# Patient Record
Sex: Female | Born: 1935 | Race: White | Hispanic: No | Smoking: Never smoker
Health system: Southern US, Community
[De-identification: ages and names within clinical notes are randomized; demographics above are authoritative.]

## PROBLEM LIST (undated history)

## (undated) DIAGNOSIS — F0280 Dementia in other diseases classified elsewhere without behavioral disturbance: Secondary | ICD-10-CM

## (undated) DIAGNOSIS — G2581 Restless legs syndrome: Secondary | ICD-10-CM

## (undated) DIAGNOSIS — H9190 Unspecified hearing loss, unspecified ear: Secondary | ICD-10-CM

## (undated) DIAGNOSIS — G309 Alzheimer's disease, unspecified: Secondary | ICD-10-CM

## (undated) DIAGNOSIS — I10 Essential (primary) hypertension: Secondary | ICD-10-CM

## (undated) DIAGNOSIS — D649 Anemia, unspecified: Secondary | ICD-10-CM

## (undated) HISTORY — PX: OTHER SURGICAL HISTORY: SHX169

## (undated) HISTORY — PX: JOINT REPLACEMENT: SHX530

---

## 2018-01-18 ENCOUNTER — Encounter (HOSPITAL_BASED_OUTPATIENT_CLINIC_OR_DEPARTMENT_OTHER): Payer: Self-pay | Admitting: Emergency Medicine

## 2018-01-18 ENCOUNTER — Emergency Department (HOSPITAL_BASED_OUTPATIENT_CLINIC_OR_DEPARTMENT_OTHER): Payer: Medicare Other

## 2018-01-18 ENCOUNTER — Other Ambulatory Visit: Payer: Self-pay

## 2018-01-18 ENCOUNTER — Inpatient Hospital Stay (HOSPITAL_BASED_OUTPATIENT_CLINIC_OR_DEPARTMENT_OTHER)
Admission: EM | Admit: 2018-01-18 | Discharge: 2018-01-22 | DRG: 481 | Disposition: A | Discharge status: Skilled Nursing Facility | Payer: Medicare Other | Source: Skilled Nursing Facility | Attending: Internal Medicine | Admitting: Internal Medicine

## 2018-01-18 DIAGNOSIS — Z96641 Presence of right artificial hip joint: Secondary | ICD-10-CM | POA: Diagnosis present

## 2018-01-18 DIAGNOSIS — S72009A Fracture of unspecified part of neck of unspecified femur, initial encounter for closed fracture: Secondary | ICD-10-CM | POA: Diagnosis present

## 2018-01-18 DIAGNOSIS — Y92129 Unspecified place in nursing home as the place of occurrence of the external cause: Secondary | ICD-10-CM

## 2018-01-18 DIAGNOSIS — I10 Essential (primary) hypertension: Secondary | ICD-10-CM | POA: Diagnosis present

## 2018-01-18 DIAGNOSIS — N390 Urinary tract infection, site not specified: Secondary | ICD-10-CM | POA: Diagnosis present

## 2018-01-18 DIAGNOSIS — H9190 Unspecified hearing loss, unspecified ear: Secondary | ICD-10-CM | POA: Diagnosis present

## 2018-01-18 DIAGNOSIS — B9620 Unspecified Escherichia coli [E. coli] as the cause of diseases classified elsewhere: Secondary | ICD-10-CM | POA: Diagnosis present

## 2018-01-18 DIAGNOSIS — W1839XA Other fall on same level, initial encounter: Secondary | ICD-10-CM | POA: Diagnosis present

## 2018-01-18 DIAGNOSIS — K219 Gastro-esophageal reflux disease without esophagitis: Secondary | ICD-10-CM | POA: Diagnosis present

## 2018-01-18 DIAGNOSIS — Z888 Allergy status to other drugs, medicaments and biological substances status: Secondary | ICD-10-CM | POA: Diagnosis not present

## 2018-01-18 DIAGNOSIS — S72002A Fracture of unspecified part of neck of left femur, initial encounter for closed fracture: Secondary | ICD-10-CM

## 2018-01-18 DIAGNOSIS — G309 Alzheimer's disease, unspecified: Secondary | ICD-10-CM | POA: Diagnosis present

## 2018-01-18 DIAGNOSIS — S72142A Displaced intertrochanteric fracture of left femur, initial encounter for closed fracture: Secondary | ICD-10-CM | POA: Diagnosis present

## 2018-01-18 DIAGNOSIS — D62 Acute posthemorrhagic anemia: Secondary | ICD-10-CM | POA: Diagnosis not present

## 2018-01-18 DIAGNOSIS — Z882 Allergy status to sulfonamides status: Secondary | ICD-10-CM | POA: Diagnosis not present

## 2018-01-18 DIAGNOSIS — F0281 Dementia in other diseases classified elsewhere with behavioral disturbance: Secondary | ICD-10-CM | POA: Diagnosis not present

## 2018-01-18 DIAGNOSIS — F0280 Dementia in other diseases classified elsewhere without behavioral disturbance: Secondary | ICD-10-CM | POA: Diagnosis present

## 2018-01-18 DIAGNOSIS — G2581 Restless legs syndrome: Secondary | ICD-10-CM | POA: Diagnosis present

## 2018-01-18 HISTORY — DX: Anemia, unspecified: D64.9

## 2018-01-18 HISTORY — DX: Dementia in other diseases classified elsewhere, unspecified severity, without behavioral disturbance, psychotic disturbance, mood disturbance, and anxiety: F02.80

## 2018-01-18 HISTORY — DX: Essential (primary) hypertension: I10

## 2018-01-18 HISTORY — DX: Alzheimer's disease, unspecified: G30.9

## 2018-01-18 HISTORY — DX: Restless legs syndrome: G25.81

## 2018-01-18 HISTORY — DX: Unspecified hearing loss, unspecified ear: H91.90

## 2018-01-18 LAB — CBC WITH DIFFERENTIAL/PLATELET
Basophils Absolute: 0 10*3/uL (ref 0.0–0.1)
Basophils Relative: 0 %
Eosinophils Absolute: 0 10*3/uL (ref 0.0–0.7)
Eosinophils Relative: 0 %
HCT: 38.4 % (ref 36.0–46.0)
Hemoglobin: 12.9 g/dL (ref 12.0–15.0)
Lymphocytes Relative: 7 %
Lymphs Abs: 0.6 10*3/uL — ABNORMAL LOW (ref 0.7–4.0)
MCH: 31.9 pg (ref 26.0–34.0)
MCHC: 33.6 g/dL (ref 30.0–36.0)
MCV: 94.8 fL (ref 78.0–100.0)
Monocytes Absolute: 0.5 10*3/uL (ref 0.1–1.0)
Monocytes Relative: 6 %
Neutro Abs: 8 10*3/uL — ABNORMAL HIGH (ref 1.7–7.7)
Neutrophils Relative %: 87 %
Platelets: 156 10*3/uL (ref 150–400)
RBC: 4.05 MIL/uL (ref 3.87–5.11)
RDW: 13.2 % (ref 11.5–15.5)
WBC: 9.2 10*3/uL (ref 4.0–10.5)

## 2018-01-18 LAB — BASIC METABOLIC PANEL
Anion gap: 10 (ref 5–15)
BUN: 26 mg/dL — ABNORMAL HIGH (ref 8–23)
CO2: 27 mmol/L (ref 22–32)
Calcium: 8.6 mg/dL — ABNORMAL LOW (ref 8.9–10.3)
Chloride: 105 mmol/L (ref 98–111)
Creatinine, Ser: 0.8 mg/dL (ref 0.44–1.00)
GFR calc Af Amer: >60 mL/min (ref >60)
GFR calc non Af Amer: >60 mL/min (ref >60)
Glucose, Bld: 170 mg/dL — ABNORMAL HIGH (ref 70–99)
Potassium: 3.7 mmol/L (ref 3.5–5.1)
Sodium: 142 mmol/L (ref 135–145)

## 2018-01-18 LAB — URINALYSIS, ROUTINE W REFLEX MICROSCOPIC
Bilirubin Urine: NEGATIVE
Glucose, UA: NEGATIVE mg/dL
Ketones, ur: NEGATIVE mg/dL
Leukocytes, UA: NEGATIVE
Nitrite: POSITIVE — AB
Protein, ur: NEGATIVE mg/dL
Specific Gravity, Urine: >1.03 — ABNORMAL HIGH (ref 1.005–1.030)
pH: 5.5 (ref 5.0–8.0)

## 2018-01-18 LAB — URINALYSIS, MICROSCOPIC (REFLEX)

## 2018-01-18 MED ORDER — HYDROMORPHONE HCL 1 MG/ML IJ SOLN
0.5000 mg | Freq: Once | INTRAMUSCULAR | Status: AC
  Administered 2018-01-18: 0.5 mg via INTRAVENOUS
  Filled 2018-01-18: qty 1
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
## 2018-01-18 NOTE — ED Notes (Signed)
Left leg is shortened and externally rotated.

## 2018-01-18 NOTE — Plan of Care (Addendum)
84yo F from SNF w dementia, hard of hearing HTN, not on blood thinner,  Sp mechanical fall Left hip FRX  Dr Marciano SequinYetes will see in AM will operate afternoon  Clear for breakfast and NPO after that  Accepted to [facility] As inpatient   Racer Quam 10:32 PM

## 2018-01-18 NOTE — ED Notes (Signed)
Son took personal wheelchair to car

## 2018-01-18 NOTE — ED Notes (Signed)
Dr Juleen ChinaKohut made aware of left leg shortening and rotation.
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
## 2018-01-19 ENCOUNTER — Encounter (HOSPITAL_COMMUNITY)
Admission: EM | Disposition: A | Discharge status: Skilled Nursing Facility | Payer: Self-pay | Source: Skilled Nursing Facility | Attending: Internal Medicine

## 2018-01-19 ENCOUNTER — Encounter (HOSPITAL_COMMUNITY): Payer: Self-pay | Admitting: Internal Medicine

## 2018-01-19 ENCOUNTER — Inpatient Hospital Stay (HOSPITAL_COMMUNITY): Payer: Medicare Other

## 2018-01-19 ENCOUNTER — Inpatient Hospital Stay (HOSPITAL_COMMUNITY): Payer: Medicare Other | Admitting: Anesthesiology

## 2018-01-19 DIAGNOSIS — I10 Essential (primary) hypertension: Secondary | ICD-10-CM | POA: Diagnosis present

## 2018-01-19 DIAGNOSIS — F0280 Dementia in other diseases classified elsewhere without behavioral disturbance: Secondary | ICD-10-CM | POA: Diagnosis present

## 2018-01-19 DIAGNOSIS — G309 Alzheimer's disease, unspecified: Secondary | ICD-10-CM

## 2018-01-19 DIAGNOSIS — S72002A Fracture of unspecified part of neck of left femur, initial encounter for closed fracture: Secondary | ICD-10-CM

## 2018-01-19 HISTORY — PX: INTRAMEDULLARY (IM) NAIL INTERTROCHANTERIC: SHX5875

## 2018-01-19 LAB — SURGICAL PCR SCREEN
MRSA, PCR: NEGATIVE
STAPHYLOCOCCUS AUREUS: NEGATIVE

## 2018-01-19 SURGERY — FIXATION, FRACTURE, INTERTROCHANTERIC, WITH INTRAMEDULLARY ROD
Anesthesia: General | Laterality: Left

## 2018-01-19 MED ORDER — LORAZEPAM 1 MG PO TABS
1.0000 mg | ORAL_TABLET | Freq: Three times a day (TID) | ORAL | Status: DC
  Administered 2018-01-20 – 2018-01-21 (×3): 1 mg via ORAL
  Filled 2018-01-19 (×6): qty 1

## 2018-01-19 MED ORDER — PHENYLEPHRINE 40 MCG/ML (10ML) SYRINGE FOR IV PUSH (FOR BLOOD PRESSURE SUPPORT)
PREFILLED_SYRINGE | INTRAVENOUS | Status: DC | PRN
  Administered 2018-01-19 (×2): 80 ug via INTRAVENOUS

## 2018-01-19 MED ORDER — SODIUM CHLORIDE 0.9 % IR SOLN
Status: DC | PRN
  Administered 2018-01-19: 1000 mL

## 2018-01-19 MED ORDER — BUPIVACAINE HCL (PF) 0.25 % IJ SOLN
INTRAMUSCULAR | Status: AC
  Filled 2018-01-19: qty 30

## 2018-01-19 MED ORDER — METOCLOPRAMIDE HCL 5 MG PO TABS: 5.0000 mg | ORAL_TABLET | Freq: Three times a day (TID) | ORAL | Status: DC | PRN

## 2018-01-19 MED ORDER — SODIUM CHLORIDE 0.9 % IV SOLN
1.0000 g | Freq: Once | INTRAVENOUS | Status: AC
  Administered 2018-01-19: 1 g via INTRAVENOUS
  Filled 2018-01-19: qty 10

## 2018-01-19 MED ORDER — PROPOFOL 10 MG/ML IV BOLUS
INTRAVENOUS | Status: AC
  Filled 2018-01-19: qty 20

## 2018-01-19 MED ORDER — PHENOL 1.4 % MT LIQD: 1.0000 | OROMUCOSAL | Status: DC | PRN

## 2018-01-19 MED ORDER — CITALOPRAM HYDROBROMIDE 20 MG PO TABS
20.0000 mg | ORAL_TABLET | Freq: Every day | ORAL | Status: DC
  Administered 2018-01-20 – 2018-01-22 (×3): 20 mg via ORAL
  Filled 2018-01-19 (×3): qty 1

## 2018-01-19 MED ORDER — SUGAMMADEX SODIUM 200 MG/2ML IV SOLN
INTRAVENOUS | Status: DC | PRN
  Administered 2018-01-19: 200 mg via INTRAVENOUS

## 2018-01-19 MED ORDER — PHENYLEPHRINE 40 MCG/ML (10ML) SYRINGE FOR IV PUSH (FOR BLOOD PRESSURE SUPPORT)
PREFILLED_SYRINGE | INTRAVENOUS | Status: AC
  Filled 2018-01-19: qty 10

## 2018-01-19 MED ORDER — BUPIVACAINE HCL 0.25 % IJ SOLN
INTRAMUSCULAR | Status: DC | PRN
  Administered 2018-01-19: 30 mL

## 2018-01-19 MED ORDER — ONDANSETRON HCL 4 MG/2ML IJ SOLN
INTRAMUSCULAR | Status: AC
  Filled 2018-01-19: qty 2

## 2018-01-19 MED ORDER — LACTATED RINGERS IV SOLN
INTRAVENOUS | Status: DC | PRN
  Administered 2018-01-19: 20:00:00 via INTRAVENOUS

## 2018-01-19 MED ORDER — MORPHINE SULFATE (PF) 2 MG/ML IV SOLN
0.5000 mg | INTRAVENOUS | Status: DC | PRN
  Administered 2018-01-19: 0.5 mg via INTRAVENOUS
  Filled 2018-01-19: qty 1

## 2018-01-19 MED ORDER — RISPERIDONE 0.25 MG PO TABS
0.2500 mg | ORAL_TABLET | Freq: Every day | ORAL | Status: DC
  Administered 2018-01-20 – 2018-01-21 (×2): 0.25 mg via ORAL
  Filled 2018-01-19 (×3): qty 1

## 2018-01-19 MED ORDER — HYDROCODONE-ACETAMINOPHEN 5-325 MG PO TABS: 1.0000 | ORAL_TABLET | ORAL | Status: DC | PRN

## 2018-01-19 MED ORDER — ONDANSETRON HCL 4 MG/2ML IJ SOLN
INTRAMUSCULAR | Status: DC | PRN
  Administered 2018-01-19: 4 mg via INTRAVENOUS

## 2018-01-19 MED ORDER — DONEPEZIL HCL 10 MG PO TABS
10.0000 mg | ORAL_TABLET | Freq: Every day | ORAL | Status: DC
  Administered 2018-01-20 – 2018-01-21 (×2): 10 mg via ORAL
  Filled 2018-01-19 (×3): qty 1

## 2018-01-19 MED ORDER — POVIDONE-IODINE 10 % EX SWAB: 2.0000 "application " | Freq: Once | CUTANEOUS | Status: DC

## 2018-01-19 MED ORDER — CEFAZOLIN SODIUM-DEXTROSE 2-4 GM/100ML-% IV SOLN: 2.0000 g | INTRAVENOUS | Status: DC

## 2018-01-19 MED ORDER — LIDOCAINE 2% (20 MG/ML) 5 ML SYRINGE
INTRAMUSCULAR | Status: AC
Start: 2018-01-19 — End: ?
  Filled 2018-01-19: qty 5

## 2018-01-19 MED ORDER — ONDANSETRON HCL 4 MG PO TABS: 4.0000 mg | ORAL_TABLET | Freq: Four times a day (QID) | ORAL | Status: DC | PRN

## 2018-01-19 MED ORDER — DIVALPROEX SODIUM 125 MG PO DR TAB
125.0000 mg | DELAYED_RELEASE_TABLET | Freq: Every day | ORAL | Status: DC
  Filled 2018-01-19: qty 1

## 2018-01-19 MED ORDER — MENTHOL 3 MG MT LOZG: 1.0000 | LOZENGE | OROMUCOSAL | Status: DC | PRN

## 2018-01-19 MED ORDER — HYDROCODONE-ACETAMINOPHEN 5-325 MG PO TABS: 1.0000 | ORAL_TABLET | Freq: Four times a day (QID) | ORAL | Status: DC | PRN

## 2018-01-19 MED ORDER — SODIUM CHLORIDE 0.45 % IV SOLN
INTRAVENOUS | Status: DC
  Administered 2018-01-19: 11:00:00 via INTRAVENOUS

## 2018-01-19 MED ORDER — FENTANYL CITRATE (PF) 100 MCG/2ML IJ SOLN
INTRAMUSCULAR | Status: DC | PRN
  Administered 2018-01-19: 50 ug via INTRAVENOUS
  Administered 2018-01-19 (×2): 25 ug via INTRAVENOUS

## 2018-01-19 MED ORDER — SUCCINYLCHOLINE CHLORIDE 200 MG/10ML IV SOSY
PREFILLED_SYRINGE | INTRAVENOUS | Status: AC
  Filled 2018-01-19: qty 10

## 2018-01-19 MED ORDER — ASPIRIN EC 325 MG PO TBEC
325.0000 mg | DELAYED_RELEASE_TABLET | Freq: Every day | ORAL | Status: DC
  Administered 2018-01-20 – 2018-01-22 (×3): 325 mg via ORAL
  Filled 2018-01-19 (×3): qty 1

## 2018-01-19 MED ORDER — MORPHINE SULFATE (PF) 2 MG/ML IV SOLN
2.0000 mg | INTRAVENOUS | Status: DC | PRN
  Administered 2018-01-19 (×2): 2 mg via INTRAVENOUS
  Filled 2018-01-19 (×2): qty 1

## 2018-01-19 MED ORDER — METOCLOPRAMIDE HCL 5 MG/ML IJ SOLN: 5.0000 mg | Freq: Three times a day (TID) | INTRAMUSCULAR | Status: DC | PRN

## 2018-01-19 MED ORDER — PROPOFOL 10 MG/ML IV BOLUS
INTRAVENOUS | Status: DC | PRN
  Administered 2018-01-19: 50 mg via INTRAVENOUS

## 2018-01-19 MED ORDER — LACTATED RINGERS IV SOLN
INTRAVENOUS | Status: DC
Start: 2018-01-19 — End: 2018-01-19

## 2018-01-19 MED ORDER — SODIUM CHLORIDE 0.45 % IV SOLN
INTRAVENOUS | Status: DC
  Administered 2018-01-20: 75 mL/h via INTRAVENOUS
  Administered 2018-01-21 (×2): via INTRAVENOUS

## 2018-01-19 MED ORDER — DOCUSATE SODIUM 100 MG PO CAPS
100.0000 mg | ORAL_CAPSULE | Freq: Two times a day (BID) | ORAL | Status: DC
  Administered 2018-01-20 – 2018-01-22 (×5): 100 mg via ORAL
  Filled 2018-01-19 (×5): qty 1

## 2018-01-19 MED ORDER — FENTANYL CITRATE (PF) 100 MCG/2ML IJ SOLN
INTRAMUSCULAR | Status: AC
  Filled 2018-01-19: qty 2

## 2018-01-19 MED ORDER — DIVALPROEX SODIUM 125 MG PO CSDR
125.0000 mg | DELAYED_RELEASE_CAPSULE | Freq: Every day | ORAL | Status: DC
  Administered 2018-01-21: 125 mg via ORAL
  Filled 2018-01-19 (×4): qty 1

## 2018-01-19 MED ORDER — SODIUM CHLORIDE 0.9 % IV SOLN
1.0000 g | INTRAVENOUS | Status: DC
  Filled 2018-01-19: qty 10

## 2018-01-19 MED ORDER — TRAMADOL HCL 50 MG PO TABS
50.0000 mg | ORAL_TABLET | Freq: Four times a day (QID) | ORAL | Status: DC
  Administered 2018-01-20 (×2): 50 mg via ORAL
  Filled 2018-01-19 (×3): qty 1

## 2018-01-19 MED ORDER — SUGAMMADEX SODIUM 200 MG/2ML IV SOLN
INTRAVENOUS | Status: AC
  Filled 2018-01-19: qty 2

## 2018-01-19 MED ORDER — ACETAMINOPHEN 500 MG PO TABS
500.0000 mg | ORAL_TABLET | Freq: Four times a day (QID) | ORAL | Status: AC
  Administered 2018-01-20 (×3): 500 mg via ORAL
  Filled 2018-01-19 (×4): qty 1

## 2018-01-19 MED ORDER — HYDROCHLOROTHIAZIDE 12.5 MG PO CAPS
12.5000 mg | ORAL_CAPSULE | Freq: Every day | ORAL | Status: DC
  Administered 2018-01-21 – 2018-01-22 (×2): 12.5 mg via ORAL
  Filled 2018-01-19 (×3): qty 1

## 2018-01-19 MED ORDER — PROMETHAZINE HCL 25 MG/ML IJ SOLN: 6.2500 mg | INTRAMUSCULAR | Status: DC | PRN

## 2018-01-19 MED ORDER — ONDANSETRON HCL 4 MG/2ML IJ SOLN: 4.0000 mg | Freq: Four times a day (QID) | INTRAMUSCULAR | Status: DC | PRN

## 2018-01-19 MED ORDER — CHLORHEXIDINE GLUCONATE 4 % EX LIQD: 60.0000 mL | Freq: Once | CUTANEOUS | Status: DC

## 2018-01-19 MED ORDER — LISINOPRIL 20 MG PO TABS
20.0000 mg | ORAL_TABLET | Freq: Every day | ORAL | Status: DC
Start: 2018-01-20 — End: 2018-01-22
  Administered 2018-01-21 – 2018-01-22 (×2): 20 mg via ORAL
  Filled 2018-01-19 (×3): qty 1

## 2018-01-19 MED ORDER — PANTOPRAZOLE SODIUM 40 MG PO TBEC
40.0000 mg | DELAYED_RELEASE_TABLET | Freq: Every day | ORAL | Status: DC
  Administered 2018-01-20 – 2018-01-22 (×3): 40 mg via ORAL
  Filled 2018-01-19 (×3): qty 1

## 2018-01-19 MED ORDER — ONDANSETRON HCL 4 MG/2ML IJ SOLN
4.0000 mg | Freq: Once | INTRAMUSCULAR | Status: AC
  Administered 2018-01-19: 4 mg via INTRAVENOUS
  Filled 2018-01-19: qty 2

## 2018-01-19 MED ORDER — DEXAMETHASONE SODIUM PHOSPHATE 10 MG/ML IJ SOLN
INTRAMUSCULAR | Status: AC
  Filled 2018-01-19: qty 1

## 2018-01-19 MED ORDER — SUCCINYLCHOLINE CHLORIDE 200 MG/10ML IV SOSY
PREFILLED_SYRINGE | INTRAVENOUS | Status: DC | PRN
  Administered 2018-01-19: 80 mg via INTRAVENOUS

## 2018-01-19 MED ORDER — LISINOPRIL 20 MG PO TABS: 20.0000 mg | ORAL_TABLET | Freq: Every day | ORAL | Status: DC

## 2018-01-19 MED ORDER — ESMOLOL HCL 100 MG/10ML IV SOLN
INTRAVENOUS | Status: AC
  Filled 2018-01-19: qty 10

## 2018-01-19 MED ORDER — CEFAZOLIN SODIUM-DEXTROSE 2-4 GM/100ML-% IV SOLN
INTRAVENOUS | Status: AC
  Filled 2018-01-19: qty 100

## 2018-01-19 MED ORDER — ROCURONIUM BROMIDE 50 MG/5ML IV SOSY
PREFILLED_SYRINGE | INTRAVENOUS | Status: DC | PRN
  Administered 2018-01-19: 20 mg via INTRAVENOUS
  Administered 2018-01-19: 10 mg via INTRAVENOUS

## 2018-01-19 MED ORDER — ESMOLOL HCL 100 MG/10ML IV SOLN
INTRAVENOUS | Status: DC | PRN
  Administered 2018-01-19: 20 mg via INTRAVENOUS

## 2018-01-19 MED ORDER — MORPHINE SULFATE (PF) 2 MG/ML IV SOLN
0.5000 mg | INTRAVENOUS | Status: DC | PRN
  Administered 2018-01-22: 0.5 mg via INTRAVENOUS
  Filled 2018-01-19: qty 1

## 2018-01-19 MED ORDER — SENNOSIDES-DOCUSATE SODIUM 8.6-50 MG PO TABS
1.0000 | ORAL_TABLET | Freq: Every day | ORAL | Status: DC
Start: 2018-01-19 — End: 2018-01-22
  Administered 2018-01-20 – 2018-01-21 (×2): 1 via ORAL
  Filled 2018-01-19 (×2): qty 1

## 2018-01-19 MED ORDER — LIDOCAINE 2% (20 MG/ML) 5 ML SYRINGE
INTRAMUSCULAR | Status: DC | PRN
  Administered 2018-01-19: 50 mg via INTRAVENOUS

## 2018-01-19 MED ORDER — ACETAMINOPHEN 325 MG PO TABS
325.0000 mg | ORAL_TABLET | Freq: Four times a day (QID) | ORAL | Status: DC | PRN
  Administered 2018-01-21: 650 mg via ORAL
  Administered 2018-01-21: 325 mg via ORAL
  Administered 2018-01-22: 650 mg via ORAL
  Administered 2018-01-22: 325 mg via ORAL
  Filled 2018-01-19 (×2): qty 2
  Filled 2018-01-19 (×2): qty 1
  Filled 2018-01-19: qty 2

## 2018-01-19 MED ORDER — CEFAZOLIN SODIUM-DEXTROSE 2-3 GM-%(50ML) IV SOLR
INTRAVENOUS | Status: DC | PRN
  Administered 2018-01-19: 2 g via INTRAVENOUS

## 2018-01-19 MED ORDER — DEXAMETHASONE SODIUM PHOSPHATE 10 MG/ML IJ SOLN
INTRAMUSCULAR | Status: DC | PRN
  Administered 2018-01-19: 10 mg via INTRAVENOUS

## 2018-01-19 MED ORDER — MORPHINE SULFATE (PF) 4 MG/ML IV SOLN: 1.0000 mg | INTRAVENOUS | Status: DC | PRN

## 2018-01-19 MED ORDER — SENNA 8.6 MG PO TABS: 1.0000 | ORAL_TABLET | Freq: Every day | ORAL | Status: DC

## 2018-01-19 MED ORDER — ROCURONIUM BROMIDE 10 MG/ML (PF) SYRINGE
PREFILLED_SYRINGE | INTRAVENOUS | Status: AC
  Filled 2018-01-19: qty 10

## 2018-01-19 SURGICAL SUPPLY — 35 items
BIT DRILL CANN LG 4.3MM (BIT) IMPLANT
BNDG COHESIVE 6X5 TAN STRL LF (GAUZE/BANDAGES/DRESSINGS) ×3 IMPLANT
COVER PERINEAL POST (MISCELLANEOUS) ×3 IMPLANT
COVER SURGICAL LIGHT HANDLE (MISCELLANEOUS) ×3 IMPLANT
DRAPE C-ARM 42X120 X-RAY (DRAPES) ×3 IMPLANT
DRAPE C-ARMOR (DRAPES) ×3 IMPLANT
DRAPE STERI IOBAN 125X83 (DRAPES) ×3 IMPLANT
DRILL BIT CANN LG 4.3MM (BIT) ×3
DRSG PAD ABDOMINAL 8X10 ST (GAUZE/BANDAGES/DRESSINGS) ×3 IMPLANT
ELECT REM PT RETURN 15FT ADLT (MISCELLANEOUS) ×3 IMPLANT
GAUZE SPONGE 4X4 12PLY STRL (GAUZE/BANDAGES/DRESSINGS) ×3 IMPLANT
GLOVE BIOGEL PI IND STRL 7.5 (GLOVE) ×1 IMPLANT
GLOVE BIOGEL PI INDICATOR 7.5 (GLOVE) ×2
GLOVE ORTHO TXT STRL SZ7.5 (GLOVE) ×3 IMPLANT
GOWN STRL REUS W/TWL LRG LVL3 (GOWN DISPOSABLE) IMPLANT
GUIDEPIN 3.2X17.5 THRD DISP (PIN) ×4 IMPLANT
KIT BASIN OR (CUSTOM PROCEDURE TRAY) ×3 IMPLANT
MANIFOLD NEPTUNE II (INSTRUMENTS) ×3 IMPLANT
NAIL HIP FRACT 130D 11X180 (Screw) ×2 IMPLANT
NEEDLE HYPO 22GX1.5 SAFETY (NEEDLE) ×3 IMPLANT
PACK ORTHO EXTREMITY (CUSTOM PROCEDURE TRAY) ×3 IMPLANT
PAD CAST 4YDX4 CTTN HI CHSV (CAST SUPPLIES) ×1 IMPLANT
PADDING CAST COTTON 4X4 STRL (CAST SUPPLIES) ×2
POSITIONER SURGICAL ARM (MISCELLANEOUS) ×6 IMPLANT
SCREW ANTI ROTATION 85MM (Screw) ×2 IMPLANT
SCREW BONE CORTICAL 5.0X38 (Screw) ×2 IMPLANT
SCREW DRILL BIT ANIT ROTATION (BIT) ×2 IMPLANT
SCREW LAG 10.5MMX105MM HFN (Screw) ×2 IMPLANT
STAPLER VISISTAT 35W (STAPLE) ×3 IMPLANT
SUT VIC AB 1 CT1 36 (SUTURE) ×6 IMPLANT
SUT VIC AB 2-0 CT1 27 (SUTURE) ×2
SUT VIC AB 2-0 CT1 TAPERPNT 27 (SUTURE) ×1 IMPLANT
SYR 20CC LL (SYRINGE) ×3 IMPLANT
TAPE CLOTH SURG 6X10 WHT LF (GAUZE/BANDAGES/DRESSINGS) ×2 IMPLANT
TOWEL OR 17X26 10 PK STRL BLUE (TOWEL DISPOSABLE) ×6 IMPLANT

## 2018-01-19 NOTE — ED Notes (Signed)
Family reports recent emesis. EDP notified. Pt alert, restless, intermittently calm, occasionally reaching, picking at IV dressing, NAD, resps e/u, no dyspnea noted, skin W&D. [facility]
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
## 2018-01-19 NOTE — Op Note (Signed)
Preop diagnosis: Comminuted left intertrochanteric hip fracture, closed.  Postop diagnosis: Same  Procedure: Left hip trochanteric nail with interlocks.  Biomet Affixis 11 x 180 mm nail with 130 degree slot and 105 lag screw with 85 mm derotational screw.  38 mm distal screw.  Surgeon: Annell GreeningMark Yates, MD  Anesthesia General +20 cc Marcaine local  EBL approximately 200 cc see anesthetic record  Complications: None  Procedure: After induction general anesthesia patient placed on the Hana table with well leg holder placement of the boot reduction after timeout procedure preoperative Ancef prophylaxis.  Traction internal rotation lateralizing the trochanter with good reduction AP and lateral prepping with DuraPrep large shower curtain Betadine Steri-Drape applied after the area squared with towels.  Incision was made proximal to the trochanter in line with the femur and gluteus medius fascia was split tip was palpated with a fingertip K wire was placed which was the Steinmann pin drilled overreamed and then the short Biomet affixes nail was placed 11 mm.  Cobb was placed over the top of the neck of the proximal fragment pushing it down to align it in good position on lateral pin was drilled up low in the head on AP center the head on lateral measured and 105 screw was placed after the derotational K wire had been placed.  85 mm derotational screw was also placed and then using the guide the distal interlock final 3 spot pictures were taken irrigation closure with deep fascia with #1 Vicryl 2-0 Vicryl subtenons tissue skin staple closure postop dressing and transferred to the recovery room.

## 2018-01-19 NOTE — Anesthesia Procedure Notes (Signed)
Procedure Name: Intubation Date/Time: 01/19/2018 8:01 PM Performed by: Montel Clock, CRNA Pre-anesthesia Checklist: Patient identified, Emergency Drugs available, Suction available, Patient being monitored and Timeout performed Patient Re-evaluated:Patient Re-evaluated prior to induction Oxygen Delivery Method: Circle system utilized Preoxygenation: Pre-oxygenation with 100% oxygen Induction Type: IV induction and Rapid sequence Laryngoscope Size: Mac and 3 Grade View: Grade I Tube type: Oral Tube size: 7.0 mm Number of attempts: 1 Airway Equipment and Method: Stylet Placement Confirmation: ETT inserted through vocal cords under direct vision,  positive ETCO2 and breath sounds checked- equal and bilateral Secured at: 21 cm Tube secured with: Tape Dental Injury: Teeth and Oropharynx as per pre-operative assessment
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
## 2018-01-19 NOTE — ED Notes (Signed)
Calmer, resting, NAD, no dyspnea[facility] Updated on plan, process and wait. Pending timeframe or transport by [facility] or [facility].

## 2018-01-19 NOTE — Anesthesia Preprocedure Evaluation (Signed)
Anesthesia Evaluation  Patient identified by MRN, date of birth, ID band Patient confused    Reviewed: Allergy & Precautions, NPO status , Patient's Chart, lab work & pertinent test results  Airway Mallampati: II  TM Distance: >3 FB Neck ROM: Full    Dental no notable dental hx.    Pulmonary neg pulmonary ROS,    Pulmonary exam normal breath sounds clear to auscultation       Cardiovascular hypertension, Normal cardiovascular exam Rhythm:Regular Rate:Normal     Neuro/Psych Dementia negative neurological ROS     GI/Hepatic negative GI ROS, Neg liver ROS,   Endo/Other  negative endocrine ROS  Renal/GU negative Renal ROS  negative genitourinary   Musculoskeletal negative musculoskeletal ROS (+)   Abdominal   Peds negative pediatric ROS (+)  Hematology negative hematology ROS (+)   Anesthesia Other Findings   Reproductive/Obstetrics negative OB ROS                             Anesthesia Physical Anesthesia Plan  ASA: III and emergent  Anesthesia Plan: General   Post-op Pain Management:    Induction: Intravenous  PONV Risk Score and Plan: 3 and Ondansetron, Dexamethasone and Treatment may vary due to age or medical condition  Airway Management Planned: Oral ETT  Additional Equipment:   Intra-op Plan:   Post-operative Plan: Extubation in OR  Informed Consent: I have reviewed the patients History and Physical, chart, labs and discussed the procedure including the risks, benefits and alternatives for the proposed anesthesia with the patient or authorized representative who has indicated his/her understanding and acceptance.   Dental advisory given  Plan Discussed with: CRNA and Surgeon  Anesthesia Plan Comments:         Anesthesia Quick Evaluation
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
## 2018-01-19 NOTE — Transfer of Care (Signed)
Immediate Anesthesia Transfer of Care Note  Patient: Rachael Hudson  Procedure(s) Performed: INTRAMEDULLARY (IM) NAIL INTERTROCHANTRIC (Left )  Patient Location: PACU  Anesthesia Type:General  Level of Consciousness: awake and alert   Airway & Oxygen Therapy: Patient Spontanous Breathing and Patient connected to face mask oxygen  Post-op Assessment: Report given to RN and Post -op Vital signs reviewed and stable  Post vital signs: Reviewed and stable  Last Vitals:  Vitals Value Taken Time  BP 142/73 01/19/2018  9:28 PM  Temp    Pulse 96 01/19/2018  9:30 PM  Resp 18 01/19/2018  9:30 PM  SpO2 98 % 01/19/2018  9:30 PM  Vitals shown include unvalidated device data.  Last Pain:  Vitals:   01/19/18 1739  TempSrc: Oral  PainSc:       Patients Stated Pain Goal: 0 (01/19/18 1208)  Complications: No apparent anesthesia complications

## 2018-01-19 NOTE — Interval H&P Note (Signed)
History and Physical Interval Note:  01/19/2018 7:14 PM  Rachael Hudson  has presented today for surgery, with the diagnosis of intertrochanteric fracture left  The various methods of treatment have been discussed with the patient and family. After consideration of risks, benefits and other options for treatment, the patient has consented to  Procedure(s): INTRAMEDULLARY (IM) NAIL INTERTROCHANTRIC (Left) as a surgical intervention .  The patient's history has been reviewed, patient examined, no change in status, stable for surgery.  I have reviewed the patient's chart and labs.  Questions were answered to the patient's satisfaction.     Eldred MangesMark C Jorge Retz
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
## 2018-01-20 ENCOUNTER — Encounter (HOSPITAL_COMMUNITY): Payer: Self-pay | Admitting: Orthopaedic Surgery

## 2018-01-20 DIAGNOSIS — N390 Urinary tract infection, site not specified: Secondary | ICD-10-CM

## 2018-01-20 LAB — CBC
HCT: 26.7 % — ABNORMAL LOW (ref 36.0–46.0)
HEMOGLOBIN: 8.8 g/dL — AB (ref 12.0–15.0)
MCH: 31.4 pg (ref 26.0–34.0)
MCHC: 33 g/dL (ref 30.0–36.0)
MCV: 95.4 fL (ref 78.0–100.0)
PLATELETS: 161 10*3/uL (ref 150–400)
RBC: 2.8 MIL/uL — AB (ref 3.87–5.11)
RDW: 13.9 % (ref 11.5–15.5)
WBC: 7.3 10*3/uL (ref 4.0–10.5)

## 2018-01-20 LAB — COMPREHENSIVE METABOLIC PANEL
ALBUMIN: 3.1 g/dL — AB (ref 3.5–5.0)
ALK PHOS: 37 U/L — AB (ref 38–126)
ALT: 11 U/L (ref 0–44)
AST: 21 U/L (ref 15–41)
Anion gap: 5 (ref 5–15)
BUN: 42 mg/dL — ABNORMAL HIGH (ref 8–23)
CO2: 29 mmol/L (ref 22–32)
CREATININE: 1.08 mg/dL — AB (ref 0.44–1.00)
Calcium: 8 mg/dL — ABNORMAL LOW (ref 8.9–10.3)
Chloride: 106 mmol/L (ref 98–111)
GFR calc non Af Amer: 44 mL/min — ABNORMAL LOW (ref >60)
GFR, EST AFRICAN AMERICAN: 50 mL/min — AB (ref >60)
Glucose, Bld: 160 mg/dL — ABNORMAL HIGH (ref 70–99)
Potassium: 4.6 mmol/L (ref 3.5–5.1)
SODIUM: 140 mmol/L (ref 135–145)
Total Bilirubin: 0.8 mg/dL (ref 0.3–1.2)
Total Protein: 5.6 g/dL — ABNORMAL LOW (ref 6.5–8.1)

## 2018-01-20 LAB — HEMOGLOBIN A1C
Hgb A1c MFr Bld: 5.3 % (ref 4.8–5.6)
Mean Plasma Glucose: 105.41 mg/dL

## 2018-01-20 MED ORDER — ENSURE ENLIVE PO LIQD
237.0000 mL | Freq: Two times a day (BID) | ORAL | Status: DC
  Administered 2018-01-22: 237 mL via ORAL

## 2018-01-20 MED ORDER — HYDROCODONE-ACETAMINOPHEN 5-325 MG PO TABS: 1.0000 | ORAL_TABLET | ORAL | 0 refills | Status: DC | PRN

## 2018-01-20 MED ORDER — ASPIRIN 325 MG PO TBEC: 325.0000 mg | DELAYED_RELEASE_TABLET | Freq: Every day | ORAL | 0 refills | Status: DC

## 2018-01-20 NOTE — Progress Notes (Signed)
Patient ID: Rachael Hudson, female   DOB: 1923-07-19, 84 y.o.   MRN: [id number]   Subjective: 1 Day Post-Op Procedure(s) (LRB): INTRAMEDULLARY (IM) NAIL INTERTROCHANTRIC (Left) Patient reports pain as mild and moderate.    Objective: Vital signs in last 24 hours: Temp:  [97.6 F (36.4 C)-98.3 F (36.8 C)] 97.6 F (36.4 C) (07/29 0558) Pulse Rate:  [53-113] 99 (07/29 0558) Resp:  [13-19] 14 (07/29 0558) BP: (113-148)/(58-80) 113/58 (07/29 0558) SpO2:  [92 %-100 %] 92 % (07/29 0106)  Intake/Output from previous day: 07/28 0701 - 07/29 0700 In: 1575.6 [I.V.:1575.6] Out: 415 [Urine:315; Blood:100] Intake/Output this shift: No intake/output data recorded.  Recent Labs    01/18/18 2057 01/20/18 0457  HGB 12.9 8.8*   Recent Labs    01/18/18 2057 01/20/18 0457  WBC 9.2 7.3  RBC 4.05 2.80*  HCT 38.4 26.7*  PLT 156 161   Recent Labs    01/18/18 2057 01/20/18 0457  NA 142 140  K 3.7 4.6  CL 105 106  CO2 27 29  BUN 26* 42*  CREATININE 0.80 1.08*  GLUCOSE 170* 160*  CALCIUM 8.6* 8.0*   No results for input(s): LABPT, INR in the last 72 hours.  Intact pulses distally No cellulitis present Compartment soft Dg C-arm 1-60 Min-no Report  Result Date: 01/19/2018 Fluoroscopy was utilized by the requesting physician.  No radiographic interpretation.   Dg Hip Operative Unilat W Or W/o Pelvis Left  Result Date: 01/19/2018 CLINICAL DATA:  Left hip fracture.  Repair. FLUOROSCOPY TIME:  53 seconds. Images: 4 EXAM: OPERATIVE LEFT HIP (WITH PELVIS IF PERFORMED) 4 VIEWS TECHNIQUE: Fluoroscopic spot image(s) were submitted for interpretation post-operatively. COMPARISON:  January 18, 2017 FINDINGS: Intramedullary rod has been placed. A screw and a gamma nail cross the intertrochanteric fracture. A distal interlocking screw is noted. No dislocation. IMPRESSION: Left hip fracture repair as above. Electronically Signed   By: Gerome Samavid  Williams III M.D   On: 01/19/2018 21:26     Assessment/Plan: 1 Day Post-Op Procedure(s) (LRB): INTRAMEDULLARY (IM) NAIL INTERTROCHANTRIC (Left) Up with therapy  Eldred MangesMark C Delberta Folts 01/20/2018, 8:53 AM

## 2018-01-20 NOTE — Plan of Care (Signed)
  Problem: Coping: Goal: Level of anxiety will decrease Outcome: Progressing   Problem: Safety: Goal: Ability to remain free from injury will improve Outcome: Progressing   Problem: Education: Goal: Knowledge of General Education information will improve Description Including pain rating scale, medication(s)/side effects and non-pharmacologic comfort measures Outcome: Not Progressing   Problem: Clinical Measurements: Goal: Ability to maintain clinical measurements within normal limits will improve Outcome: Progressing
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
## 2018-01-20 NOTE — Progress Notes (Signed)
Md aware of patient's low output, sedation and holding sedation medications, and patient not eating or drinking.

## 2018-01-20 NOTE — Progress Notes (Signed)
PT Cancellation Note  Patient Details Name: Rachael Hudson MRN: [id number] DOB: 1924-03-19   Cancelled Treatment:    Reason Eval/Treat Not Completed: Patient's level of consciousness(pt sleeping soundly, did not arouse to tactile nor verbal stimulii, nor with gentle PROM of LLE. Per phone call with pt's son, pt walks with a rollator at baseline. Will follow. )   Tamala SerUhlenberg, Naria Abbey Kistler 01/20/2018, 12:19 PM [phone]
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
## 2018-01-20 NOTE — Progress Notes (Signed)
Plan for d/c to SNF, discharge planning per CSW. [phone] 

## 2018-01-21 DIAGNOSIS — F0281 Dementia in other diseases classified elsewhere with behavioral disturbance: Secondary | ICD-10-CM

## 2018-01-21 DIAGNOSIS — G309 Alzheimer's disease, unspecified: Secondary | ICD-10-CM

## 2018-01-21 DIAGNOSIS — N390 Urinary tract infection, site not specified: Secondary | ICD-10-CM

## 2018-01-21 LAB — COMPREHENSIVE METABOLIC PANEL
ALBUMIN: 2.7 g/dL — AB (ref 3.5–5.0)
ALT: 11 U/L (ref 0–44)
ANION GAP: 6 (ref 5–15)
AST: 28 U/L (ref 15–41)
Alkaline Phosphatase: 34 U/L — ABNORMAL LOW (ref 38–126)
BUN: 47 mg/dL — ABNORMAL HIGH (ref 8–23)
CO2: 28 mmol/L (ref 22–32)
Calcium: 7.8 mg/dL — ABNORMAL LOW (ref 8.9–10.3)
Chloride: 104 mmol/L (ref 98–111)
Creatinine, Ser: 0.87 mg/dL (ref 0.44–1.00)
GFR calc Af Amer: >60 mL/min (ref >60)
GFR calc non Af Amer: 57 mL/min — ABNORMAL LOW (ref >60)
GLUCOSE: 81 mg/dL (ref 70–99)
POTASSIUM: 3.8 mmol/L (ref 3.5–5.1)
Sodium: 138 mmol/L (ref 135–145)
Total Bilirubin: 0.5 mg/dL (ref 0.3–1.2)
Total Protein: 5.1 g/dL — ABNORMAL LOW (ref 6.5–8.1)

## 2018-01-21 LAB — ABO/RH: ABO/RH(D): B POS

## 2018-01-21 LAB — CBC
HCT: 22.1 % — ABNORMAL LOW (ref 36.0–46.0)
HEMOGLOBIN: 7.4 g/dL — AB (ref 12.0–15.0)
MCH: 32 pg (ref 26.0–34.0)
MCHC: 33.5 g/dL (ref 30.0–36.0)
MCV: 95.7 fL (ref 78.0–100.0)
Platelets: 139 10*3/uL — ABNORMAL LOW (ref 150–400)
RBC: 2.31 MIL/uL — ABNORMAL LOW (ref 3.87–5.11)
RDW: 13.8 % (ref 11.5–15.5)
WBC: 5.6 10*3/uL (ref 4.0–10.5)

## 2018-01-21 LAB — PREPARE RBC (CROSSMATCH)

## 2018-01-21 MED ORDER — SODIUM CHLORIDE 0.9 % IV SOLN
1.0000 g | INTRAVENOUS | Status: DC
  Administered 2018-01-21: 1 g via INTRAVENOUS
  Filled 2018-01-21: qty 10
  Filled 2018-01-21: qty 1

## 2018-01-21 MED ORDER — SODIUM CHLORIDE 0.9% IV SOLUTION: Freq: Once | INTRAVENOUS | Status: DC

## 2018-01-21 NOTE — Progress Notes (Signed)
Patietnt pulled out IV at 1335, and paused blood and was able to restart IV and get blood restarted at 1345.

## 2018-01-21 NOTE — [place] FL2 (Addendum)
[place] MEDICAID FL2 LEVEL OF CARE SCREENING TOOL     IDENTIFICATION  Patient Name: Rachael Hudson Birthdate: Jun 18, 1924 Sex: female Admission Date (Current Location): 01/18/2018  [facility] [place]   Facility and Address:  [facility],  [address], [place] [id number]      Provider Number: [id number]  Attending Physician Name and Address:  Burnadette PopAdhikari, Amrit, MD  Relative Name and Phone Number:       Current Level of Care: Hospital Recommended Level of Care: Skilled Nursing Facility Prior Approval Number:    Date Approved/Denied:   PASRR Number:   [id number]   Discharge Plan: SNF    Current Diagnoses: Patient Active Problem List   Diagnosis Date Noted  . Urinary tract infection without hematuria   . Alzheimer's dementia 01/19/2018  . HTN (hypertension) 01/19/2018  . Hip fracture (HCC) 01/18/2018    Orientation RESPIRATION BLADDER Height & Weight     Self  Normal Continent Weight:   Height:     BEHAVIORAL SYMPTOMS/MOOD NEUROLOGICAL BOWEL NUTRITION STATUS      Continent Diet  AMBULATORY STATUS COMMUNICATION OF NEEDS Skin   Extensive Assist Verbally Surgical wounds                       Personal Care Assistance Level of Assistance  Bathing, Feeding, Dressing Bathing Assistance: Maximum assistance Feeding assistance: Limited assistance Dressing Assistance: Maximum assistance     Functional Limitations Info  Sight, Hearing, Speech Sight Info: Adequate Hearing Info: Adequate Speech Info: Adequate    SPECIAL CARE FACTORS FREQUENCY  PT (By licensed PT), OT (By licensed OT)     PT Frequency: 5x/week OT Frequency: 5x/week            Contractures Contractures Info: Present    Additional Factors Info  Code Status, Allergies Code Status Info: Fullcode  Allergies Info: Fluoxetine, Sulfa Antibiotics           Current Medications (01/21/2018):  This is the current hospital active medication  list Current Facility-Administered Medications  Medication Dose Route Frequency Provider Last Rate Last Dose  . 0.9 %  sodium chloride infusion (Manually program via Guardrails IV Fluids)   Intravenous Once Burnadette PopAdhikari, Amrit, MD      . acetaminophen (TYLENOL) tablet 325-650 mg  325-650 mg Oral Q6H PRN Eldred MangesYates, Mark C, MD   650 mg at 01/21/18 0929  . aspirin EC tablet 325 mg  325 mg Oral Q breakfast Eldred MangesYates, Mark C, MD   325 mg at 01/21/18 0929  . cefTRIAXone (ROCEPHIN) 1 g in sodium chloride 0.9 % 100 mL IVPB  1 g Intravenous Q24H Adhikari, Amrit, MD      . citalopram (CELEXA) tablet 20 mg  20 mg Oral Daily Eldred MangesYates, Mark C, MD   20 mg at 01/21/18 0929  . divalproex (DEPAKOTE SPRINKLE) capsule 125 mg  125 mg Oral Q1400 Pearson GrippeKim, James, MD      . docusate sodium (COLACE) capsule 100 mg  100 mg Oral BID Eldred MangesYates, Mark C, MD   100 mg at 01/21/18 0929  . donepezil (ARICEPT) tablet 10 mg  10 mg Oral QHS Eldred MangesYates, Mark C, MD   10 mg at 01/20/18 2133  . feeding supplement (ENSURE ENLIVE) (ENSURE ENLIVE) liquid 237 mL  237 mL Oral BID BM Pearson GrippeKim, James, MD      . hydrochlorothiazide (MICROZIDE) capsule 12.5 mg  12.5 mg Oral Daily Eldred MangesYates, Mark C, MD   12.5 mg at 01/21/18 0929  .  HYDROcodone-acetaminophen (NORCO/VICODIN) 5-325 MG per tablet 1-2 tablet  1-2 tablet Oral Q4H PRN Eldred Manges, MD      . lisinopril (PRINIVIL,ZESTRIL) tablet 20 mg  20 mg Oral Daily Eldred Manges, MD   20 mg at 01/21/18 0929  . LORazepam (ATIVAN) tablet 1 mg  1 mg Oral Q8H Pearson Grippe, MD   1 mg at 01/21/18 0981  . menthol-cetylpyridinium (CEPACOL) lozenge 3 mg  1 lozenge Oral PRN Eldred Manges, MD       Or  . phenol (CHLORASEPTIC) mouth spray 1 spray  1 spray Mouth/Throat PRN Eldred Manges, MD      . metoCLOPramide (REGLAN) tablet 5-10 mg  5-10 mg Oral Q8H PRN Eldred Manges, MD       Or  . metoCLOPramide (REGLAN) injection 5-10 mg  5-10 mg Intravenous Q8H PRN Eldred Manges, MD      . morphine 2 MG/ML injection 0.5-1 mg  0.5-1 mg Intravenous Q2H PRN  Eldred Manges, MD      . ondansetron [facility]) tablet 4 mg  4 mg Oral Q6H PRN Eldred Manges, MD       Or  . ondansetron [facility]) injection 4 mg  4 mg Intravenous Q6H PRN Eldred Manges, MD      . pantoprazole (PROTONIX) EC tablet 40 mg  40 mg Oral Daily Eldred Manges, MD   40 mg at 01/21/18 0929  . risperiDONE (RISPERDAL) tablet 0.25 mg  0.25 mg Oral QHS Eldred Manges, MD   0.25 mg at 01/20/18 2139  . senna-docusate (Senokot-S) tablet 1 tablet  1 tablet Oral QHS Eldred Manges, MD   1 tablet at 01/20/18 2133  . traMADol (ULTRAM) tablet 50 mg  50 mg Oral Q6H Pearson Grippe, MD   50 mg at 01/20/18 1134     Discharge Medications: Please see discharge summary for a list of discharge medications.  Relevant Imaging Results:  Relevant Lab Results:   Additional Information ssn: [id number]  Clearance Coots, LCSW

## 2018-01-21 NOTE — Progress Notes (Signed)
PROGRESS NOTE    Rachael Hudson  UJW:[id number] DOB: 05/19/1924 DOA: 01/18/2018 PCP: Keane Police, MD   Brief Narrative: Patient is a 84 year old female with past medical history of advanced Alzheimer's dementia, hypertension who was brought from skilled nursing facility after a fall.  Patient complained of hip pain and had deformity following the fall.  She was found to have left-sided acute comminuted intertrochanteric fracture.  Orthopedics was consulted and she underwent left hip intertrochanteric nailing .  Assessment & Plan:   Principal Problem:   Hip fracture (HCC) Active Problems:   Alzheimer's dementia   HTN (hypertension)   Urinary tract infection without hematuria  Left hip fracture: Found to have acute comminuted intertrochanteric fracture.  Underwent left hip intertrochanteric nailing.  Orthopedics were following.  On aspirin for DVT prophylaxis.  Continue pain management.  She will be discharged back to skilled nursing facility likely tomorrow. She will follow-up with orthopedics as an outpatient.  Urinary tract infection: Unclear if patient is having symptoms of dysuria.  Urine cultures growing significant amount of gram-negative rods.  Will resume ceftriaxone.  We will follow the final sensitivity report.  Acute blood loss anemia: Most likely secondary to hip fracture and surgery.  Hemoglobin was found to be 7.4.  Her hemoglobin on presentation was 12.9.  We will transfuse her with 1 unit of PRBC today.  Dementia: Continue her home meds.  Continue supportive care  Hypertension: Currently her blood pressure stable.  Continue her home meds  GERD: Continue PPI.   DVT prophylaxis: Aspirin Code Status: Full Family Communication: Family member present at the bedside Disposition Plan: Skilled nursing facility tomorrow   Consultants: Orthopedics  Procedures: Intramedullary nailing  Antimicrobials: None  Subjective: Patient seen and examined the  bedside this morning.  Looks comfortable.  Complains of left hip pain.  Very hard on hearing.  Advanced dementia.  She was participating with physical therapy today.  Objective: Vitals:   01/20/18 1303 01/20/18 2136 01/21/18 0531 01/21/18 0932  BP: 125/63 133/63 120/63 (!) 123/55  Pulse: 96 92 93 79  Resp: 16 15 16    Temp: 98.2 F (36.8 C) 97.6 F (36.4 C) 97.6 F (36.4 C) 97.8 F (36.6 C)  TempSrc: Oral Oral Oral   SpO2: 99% 100% 100%     Intake/Output Summary (Last 24 hours) at 01/21/2018 1036 Last data filed at 01/21/2018 1000 Gross per 24 hour  Intake 1869.58 ml  Output 675 ml  Net 1194.58 ml   There were no vitals filed for this visit.  Examination:  General exam: Appears calm and comfortable ,Not in distress, elderly female HEENT:PERRL,Oral mucosa moist, Ear/Nose normal on gross exam, hard on hearing Respiratory system: Bilateral equal air entry, normal vesicular breath sounds, no wheezes or crackles  Cardiovascular system: S1 & S2 heard, RRR. No JVD, murmurs, rubs, gallops or clicks. No pedal edema. Gastrointestinal system: Abdomen is nondistended, soft and nontender. No organomegaly or masses felt. Normal bowel sounds heard. Central nervous system: Alert .orientation could not be determined due to advanced dementia extremities: No edema, no clubbing ,no cyanosis, distal peripheral pulses palpable.  Clean surgical wound on the left hip with associated tenderness Skin: No rashes, lesions or ulcers,no icterus ,no pallor MSK: Normal muscle bulk,tone ,power Psychiatry: Judgement and insight appear impaired     Data Reviewed: I have personally reviewed following labs and imaging studies  CBC: Recent Labs  Lab 01/18/18 2057 01/20/18 0457 01/21/18 0523 01/21/18 0810  WBC 9.2 7.3 QUESTIONABLE RESULTS, RECOMMEND RECOLLECT TO VERIFY  5.6  NEUTROABS 8.0*  --   --   --   HGB 12.9 8.8* QUESTIONABLE RESULTS, RECOMMEND RECOLLECT TO VERIFY 7.4*  HCT 38.4 26.7* QUESTIONABLE  RESULTS, RECOMMEND RECOLLECT TO VERIFY 22.1*  MCV 94.8 95.4 QUESTIONABLE RESULTS, RECOMMEND RECOLLECT TO VERIFY 95.7  PLT 156 161 QUESTIONABLE RESULTS, RECOMMEND RECOLLECT TO VERIFY 139*   Basic Metabolic Panel: Recent Labs  Lab 01/18/18 2057 01/20/18 0457 01/21/18 0523 01/21/18 0810  NA 142 140 QUESTIONABLE RESULTS, RECOMMEND RECOLLECT TO VERIFY 138  K 3.7 4.6 QUESTIONABLE RESULTS, RECOMMEND RECOLLECT TO VERIFY 3.8  CL 105 106 QUESTIONABLE RESULTS, RECOMMEND RECOLLECT TO VERIFY 104  CO2 27 29 QUESTIONABLE RESULTS, RECOMMEND RECOLLECT TO VERIFY 28  GLUCOSE 170* 160* QUESTIONABLE RESULTS, RECOMMEND RECOLLECT TO VERIFY 81  BUN 26* 42* QUESTIONABLE RESULTS, RECOMMEND RECOLLECT TO VERIFY 47*  CREATININE 0.80 1.08* QUESTIONABLE RESULTS, RECOMMEND RECOLLECT TO VERIFY 0.87  CALCIUM 8.6* 8.0* QUESTIONABLE RESULTS, RECOMMEND RECOLLECT TO VERIFY 7.8*   GFR: CrCl cannot be calculated (Unknown ideal weight.). Liver Function Tests: Recent Labs  Lab 01/20/18 0457 01/21/18 0523 01/21/18 0810  AST 21 QUESTIONABLE RESULTS, RECOMMEND RECOLLECT TO VERIFY 28  ALT 11 QUESTIONABLE RESULTS, RECOMMEND RECOLLECT TO VERIFY 11  ALKPHOS 37* QUESTIONABLE RESULTS, RECOMMEND RECOLLECT TO VERIFY 34*  BILITOT 0.8 QUESTIONABLE RESULTS, RECOMMEND RECOLLECT TO VERIFY 0.5  PROT 5.6* QUESTIONABLE RESULTS, RECOMMEND RECOLLECT TO VERIFY 5.1*  ALBUMIN 3.1* QUESTIONABLE RESULTS, RECOMMEND RECOLLECT TO VERIFY 2.7*   No results for input(s): LIPASE, AMYLASE in the last 168 hours. No results for input(s): AMMONIA in the last 168 hours. Coagulation Profile: No results for input(s): INR, PROTIME in the last 168 hours. Cardiac Enzymes: No results for input(s): CKTOTAL, CKMB, CKMBINDEX, TROPONINI in the last 168 hours. BNP (last 3 results) No results for input(s): PROBNP in the last 8760 hours. HbA1C: Recent Labs    01/20/18 0457  HGBA1C 5.3   CBG: No results for input(s): GLUCAP in the last 168 hours. Lipid  Profile: No results for input(s): CHOL, HDL, LDLCALC, TRIG, CHOLHDL, LDLDIRECT in the last 72 hours. Thyroid Function Tests: No results for input(s): TSH, T4TOTAL, FREET4, T3FREE, THYROIDAB in the last 72 hours. Anemia Panel: No results for input(s): VITAMINB12, FOLATE, FERRITIN, TIBC, IRON, RETICCTPCT in the last 72 hours. Sepsis Labs: No results for input(s): PROCALCITON, LATICACIDVEN in the last 168 hours.  Recent Results (from the past 240 hour(s))  Urine culture     Status: Abnormal (Preliminary result)   Collection Time: 01/18/18 11:18 PM  Result Value Ref Range Status   Specimen Description   Final    URINE, CATHETERIZED Performed at [facility] [place], [address] [place], [place] [address]    Special Requests   Final    NONE Performed at [facility], [address] [place], [place] [address]    Culture >=100,000 COLONIES/mL GRAM NEGATIVE RODS (A)  Final   Report Status PENDING  Incomplete  Surgical pcr screen     Status: None   Collection Time: 01/19/18  2:56 PM  Result Value Ref Range Status   MRSA, PCR NEGATIVE NEGATIVE Final   Staphylococcus aureus NEGATIVE NEGATIVE Final    Comment: (NOTE) The Xpert SA Assay (FDA approved for NASAL specimens in patients 82 years of age and older), is one component of a comprehensive surveillance program. It is not intended to diagnose infection nor to guide or monitor treatment. Performed at [facility], [address] [place], [place] [address]  Radiology Studies: Dg C-arm 1-60 Min-no Report  Result Date: 01/19/2018 Fluoroscopy was utilized by the requesting physician.  No radiographic interpretation.   Dg Hip Operative Unilat W Or W/o Pelvis Left  Result Date: 01/19/2018 CLINICAL DATA:  Left hip fracture.  Repair. FLUOROSCOPY TIME:  53 seconds. Images: 4 EXAM: OPERATIVE LEFT HIP (WITH PELVIS IF PERFORMED) 4 VIEWS TECHNIQUE: Fluoroscopic spot image(s) were submitted for  interpretation post-operatively. COMPARISON:  January 18, 2017 FINDINGS: Intramedullary rod has been placed. A screw and a gamma nail cross the intertrochanteric fracture. A distal interlocking screw is noted. No dislocation. IMPRESSION: Left hip fracture repair as above. Electronically Signed   By: Gerome Sam III M.D   On: 01/19/2018 21:26        Scheduled Meds: . sodium chloride   Intravenous Once  . aspirin EC  325 mg Oral Q breakfast  . citalopram  20 mg Oral Daily  . divalproex  125 mg Oral Q1400  . docusate sodium  100 mg Oral BID  . donepezil  10 mg Oral QHS  . feeding supplement (ENSURE ENLIVE)  237 mL Oral BID BM  . hydrochlorothiazide  12.5 mg Oral Daily  . lisinopril  20 mg Oral Daily  . LORazepam  1 mg Oral Q8H  . pantoprazole  40 mg Oral Daily  . risperiDONE  0.25 mg Oral QHS  . senna-docusate  1 tablet Oral QHS  . traMADol  50 mg Oral Q6H   Continuous Infusions: . sodium chloride 100 mL/hr at 01/21/18 0940  . cefTRIAXone (ROCEPHIN)  IV       LOS: 3 days    Time spent:25 mins       Burnadette Pop, MD [facility] Pager [phone]  If 7PM-7AM, please contact night-coverage [email or web address] Password [email or web address] 01/21/2018, 10:36 AM

## 2018-01-21 NOTE — Anesthesia Postprocedure Evaluation (Signed)
Anesthesia Post Note  Patient: Rachael Hudson  Procedure(s) Performed: INTRAMEDULLARY (IM) NAIL INTERTROCHANTRIC (Left )     Patient location during evaluation: PACU Anesthesia Type: General Level of consciousness: awake and alert Pain management: pain level controlled Vital Signs Assessment: post-procedure vital signs reviewed and stable Respiratory status: spontaneous breathing, nonlabored ventilation, respiratory function stable and patient connected to nasal cannula oxygen Cardiovascular status: blood pressure returned to baseline and stable Postop Assessment: no apparent nausea or vomiting Anesthetic complications: no    Last Vitals:  Vitals:   01/20/18 2136 01/21/18 0531  BP: 133/63 120/63  Pulse: 92 93  Resp: 15 16  Temp: 36.4 C 36.4 C  SpO2: 100% 100%    Last Pain:  Vitals:   01/21/18 0531  TempSrc: Oral  PainSc:                  Ivonne Freeburg S

## 2018-01-21 NOTE — Progress Notes (Signed)
OT Cancellation Note  Patient Details Name: Rachael Laughteratricia Raven MRN: [id number] DOB: December 23, 1923   Cancelled Treatment:  Noted plan for SNF- will defer OT eval to SNF [place], [place] D 01/21/2018, 12:54 PM
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
## 2018-01-21 NOTE — Progress Notes (Signed)
Patient has bed at [facility] when medically stable.

## 2018-01-21 NOTE — Progress Notes (Signed)
   Subjective: 2 Days Post-Op Procedure(s) (LRB): INTRAMEDULLARY (IM) NAIL INTERTROCHANTRIC (Left) Patient reports pain as denies pain. HOH, agitated at times.   Objective: Vital signs in last 24 hours: Temp:  [97.6 F (36.4 C)-99 F (37.2 C)] 97.6 F (36.4 C) (07/30 0531) Pulse Rate:  [92-105] 93 (07/30 0531) Resp:  [15-16] 16 (07/30 0531) BP: (116-133)/(59-63) 120/63 (07/30 0531) SpO2:  [95 %-100 %] 100 % (07/30 0531)  Intake/Output from previous day: 07/29 0701 - 07/30 0700 In: 1577.1 [I.V.:1577.1] Out: 525 [Urine:525] Intake/Output this shift: No intake/output data recorded.  Recent Labs    01/18/18 2057 01/20/18 0457 01/21/18 0523  HGB 12.9 8.8* QUESTIONABLE RESULTS, RECOMMEND RECOLLECT TO VERIFY   Recent Labs    01/20/18 0457 01/21/18 0523  WBC 7.3 QUESTIONABLE RESULTS, RECOMMEND RECOLLECT TO VERIFY  RBC 2.80* QUESTIONABLE RESULTS, RECOMMEND RECOLLECT TO VERIFY  HCT 26.7* QUESTIONABLE RESULTS, RECOMMEND RECOLLECT TO VERIFY  PLT 161 QUESTIONABLE RESULTS, RECOMMEND RECOLLECT TO VERIFY   Recent Labs    01/20/18 0457 01/21/18 0523  NA 140 QUESTIONABLE RESULTS, RECOMMEND RECOLLECT TO VERIFY  K 4.6 QUESTIONABLE RESULTS, RECOMMEND RECOLLECT TO VERIFY  CL 106 QUESTIONABLE RESULTS, RECOMMEND RECOLLECT TO VERIFY  CO2 29 QUESTIONABLE RESULTS, RECOMMEND RECOLLECT TO VERIFY  BUN 42* QUESTIONABLE RESULTS, RECOMMEND RECOLLECT TO VERIFY  CREATININE 1.08* QUESTIONABLE RESULTS, RECOMMEND RECOLLECT TO VERIFY  GLUCOSE 160* QUESTIONABLE RESULTS, RECOMMEND RECOLLECT TO VERIFY  CALCIUM 8.0* QUESTIONABLE RESULTS, RECOMMEND RECOLLECT TO VERIFY   No results for input(s): LABPT, INR in the last 72 hours.  Neurologically intact, slight old blood on dressing. Thigh soft.  No results found.  Assessment/Plan: 2 Days Post-Op Procedure(s) (LRB): INTRAMEDULLARY (IM) NAIL INTERTROCHANTRIC (Left) Plan:  SNF , repeat of AM labs being drawn due to Hgb  ? 6.8  Eldred MangesMark C Azaleah Usman 01/21/2018,  7:57 AM

## 2018-01-21 NOTE — Care Management Important Message (Signed)
Important Message  Patient Details  Name: Rachael Hudson MRN: [id number] Date of Birth: 1924-03-17   Medicare Important Message Given:  Yes    Caren MacadamFuller, Asheton Scheffler 01/21/2018, 11:05 AMImportant Message  Patient Details  Name: Rachael Hudson MRN: [id number] Date of Birth: 1924-03-17   Medicare Important Message Given:  Yes    Caren MacadamFuller, Yuridia Couts 01/21/2018, 11:05 AM

## 2018-01-22 LAB — BPAM RBC
Blood Product Expiration Date: 201908222359
ISSUE DATE / TIME: 201907301232
Unit Type and Rh: 7300

## 2018-01-22 LAB — CBC WITH DIFFERENTIAL/PLATELET
Basophils Absolute: 0 10*3/uL (ref 0.0–0.1)
Basophils Relative: 0 %
EOS PCT: 1 %
Eosinophils Absolute: 0.1 10*3/uL (ref 0.0–0.7)
HEMATOCRIT: 27.7 % — AB (ref 36.0–46.0)
Hemoglobin: 9.1 g/dL — ABNORMAL LOW (ref 12.0–15.0)
LYMPHS ABS: 0.9 10*3/uL (ref 0.7–4.0)
LYMPHS PCT: 17 %
MCH: 30.1 pg (ref 26.0–34.0)
MCHC: 32.9 g/dL (ref 30.0–36.0)
MCV: 91.7 fL (ref 78.0–100.0)
Monocytes Absolute: 0.7 10*3/uL (ref 0.1–1.0)
Monocytes Relative: 12 %
NEUTROS ABS: 3.8 10*3/uL (ref 1.7–7.7)
Neutrophils Relative %: 70 %
PLATELETS: 135 10*3/uL — AB (ref 150–400)
RBC: 3.02 MIL/uL — AB (ref 3.87–5.11)
RDW: 14.8 % (ref 11.5–15.5)
WBC: 5.4 10*3/uL (ref 4.0–10.5)

## 2018-01-22 LAB — URINE CULTURE: Culture: >=100000 — AB

## 2018-01-22 LAB — TYPE AND SCREEN
ABO/RH(D): B POS
Antibody Screen: NEGATIVE
Unit division: 0

## 2018-01-22 MED ORDER — CEPHALEXIN 500 MG PO CAPS: 500.0000 mg | ORAL_CAPSULE | Freq: Two times a day (BID) | ORAL | 0 refills | Status: AC

## 2018-01-22 NOTE — Clinical Social Work Placement (Addendum)
D/c Summary sent.  Nurse given number to call report.  PTAR called for transport.   CLINICAL SOCIAL WORK PLACEMENT  NOTE  Date:  01/22/2018  Patient Details  Name: Rachael Hudson MRN: [id number] Date of Birth: 1924-02-03  Clinical Social Work is seeking post-discharge placement for this patient at the Skilled  Nursing Facility level of care (*CSW will initial, date and re-position this form in  chart as items are completed):  Yes   Patient/family provided with [facility] Clinical Social Work Department's list of facilities offering this level of care within the geographic area requested by the patient (or if unable, by the patient's family).  Yes   Patient/family informed of their freedom to choose among providers that offer the needed level of care, that participate in Medicare, Medicaid or managed care program needed by the patient, have an available bed and are willing to accept the patient.  Yes   Patient/family informed of [facility]'s ownership interest in [facility] and [facility], as well as of the fact that they are under no obligation to receive care at these facilities.  PASRR submitted to EDS on 01/21/18     PASRR number received on 01/21/18     Existing PASRR number confirmed on       FL2 transmitted to all facilities in geographic area requested by pt/family on       FL2 transmitted to all facilities within larger geographic area on       Patient informed that his/her managed care company has contracts with or will negotiate with certain facilities, including the following:            Patient/family informed of bed offers received.  Patient [facility] bed at     [facility]   Physician recommends and patient chooses bed at   [facility]   [facility].  Patient to be transferred to facility by PTAR      Patient family notified on 01/22/18 of transfer.  Name of family member notified:    Son      PHYSICIAN Please prepare priority discharge summary, including medications     Additional Comment:    _______________________________________________ Clearance CootsNicole A Melvinia Ashby, LCSW 01/22/2018, 8:50 AM

## 2018-01-22 NOTE — Progress Notes (Signed)
Report given to Rachael BraunKaren at [facility]

## 2018-01-22 NOTE — Progress Notes (Signed)
   Subjective: 3 Days Post-Op Procedure(s) (LRB): INTRAMEDULLARY (IM) NAIL INTERTROCHANTRIC (Left) Patient reports pain as mild.    Objective: Vital signs in last 24 hours: Temp:  [97.5 F (36.4 C)-98.6 F (37 C)] 98.1 F (36.7 C) (07/31 0522) Pulse Rate:  [51-123] 77 (07/31 0522) Resp:  [16-20] 18 (07/31 0522) BP: (108-153)/(52-91) 153/66 (07/31 0522) SpO2:  [89 %-100 %] 100 % (07/31 0522)  Intake/Output from previous day: 07/30 0701 - 07/31 0700 In: 1074 [P.O.:720; Blood:354] Out: 1100 [Urine:1100] Intake/Output this shift: No intake/output data recorded.  Recent Labs    01/20/18 0457 01/21/18 0523 01/21/18 0810 01/22/18 0511  HGB 8.8* QUESTIONABLE RESULTS, RECOMMEND RECOLLECT TO VERIFY 7.4* 9.1*   Recent Labs    01/21/18 0810 01/22/18 0511  WBC 5.6 5.4  RBC 2.31* 3.02*  HCT 22.1* 27.7*  PLT 139* 135*   Recent Labs    01/21/18 0523 01/21/18 0810  NA QUESTIONABLE RESULTS, RECOMMEND RECOLLECT TO VERIFY 138  K QUESTIONABLE RESULTS, RECOMMEND RECOLLECT TO VERIFY 3.8  CL QUESTIONABLE RESULTS, RECOMMEND RECOLLECT TO VERIFY 104  CO2 QUESTIONABLE RESULTS, RECOMMEND RECOLLECT TO VERIFY 28  BUN QUESTIONABLE RESULTS, RECOMMEND RECOLLECT TO VERIFY 47*  CREATININE QUESTIONABLE RESULTS, RECOMMEND RECOLLECT TO VERIFY 0.87  GLUCOSE QUESTIONABLE RESULTS, RECOMMEND RECOLLECT TO VERIFY 81  CALCIUM QUESTIONABLE RESULTS, RECOMMEND RECOLLECT TO VERIFY 7.8*   No results for input(s): LABPT, INR in the last 72 hours.  Neurologically intact No results found.  Assessment/Plan: 3 Days Post-Op Procedure(s) (LRB): INTRAMEDULLARY (IM) NAIL INTERTROCHANTRIC (Left) Plan  SNF  Rachael Hudson 01/22/2018, 7:04 AM

## 2018-01-22 NOTE — Clinical Social Work Note (Signed)
Clinical Social Work Assessment  Patient Details  Name: Rachael Hudson MRN: [id number] Date of Birth: 1924-01-03  Date of referral:  01/21/18               Reason for consult:  Facility Placement                Permission sought to share information with:  Facility Art therapist granted to share information::  Yes, Verbal Permission Granted  Name::        Agency::  SNF  Relationship::  Son   Contact Information:     Housing/Transportation Living arrangements for the past 2 months:  [place] of Information:  Patient Patient Interpreter Needed:  None Criminal Activity/Legal Involvement Pertinent to Current Situation/Hospitalization:  No - Comment as needed Significant Relationships:  None Lives with:  Facility Resident Do you feel safe going back to the place where you live?  Yes Need for family participation in patient care:  Yes (Comment)  Care giving concerns:   Hip Fracture , PT recommends SNF for short rehab.   Social Worker assessment / plan: The patient has advanced Alzheimer's.  CSW met with patient son at bedside, explained role and reason for visit-to assist with discharging plan to SNF. Patient son reports prior to patient fall, she was ambulatory and used a walker for assistance. He reports just last week the patient went to [place] store with him. The patient currently resides in [facility] home [facility]F. He reports they provide assistance all ADL's.  Patient son states he is very pleased with her care there. Patient son understands the patient will need to go to Skilled facility for rehab before returning back to her ALF. Patient son has been proactive in searching for a SNF and spoke with facility representative a [facility] for possible placement.   CSW followed up with the liaison Robin at SNF, the facility has accepted the patient. Marland Kitchen FL2 completed.  PASRR done.   Plan: SNF  Employment status:   Retired Forensic scientist:  Medicare PT Recommendations:  [place] / Referral to community resources:  [place]  Patient/Family's Response to care:  Agreeable and Responding well to care.   Patient/Family's Understanding of and Emotional Response to Diagnosis, Current Treatment, and Prognosis:  Patient son has a very good understanding of patient diagnosis and current treatment.   Emotional Assessment Appearance:  Appears stated age Attitude/Demeanor/Rapport:    Affect (typically observed):  Accepting Orientation:    Alcohol / Substance use:  Not Applicable Psych involvement (Current and /or in the community):  No (Comment)  Discharge Needs  Concerns to be addressed:  Discharge Planning Concerns Readmission within the last 30 days:  No Current discharge risk:  Dependent with Mobility, Cognitively Impaired Barriers to Discharge:  Continued Medical Work up   [employer], LCSW 01/22/2018, 8:39 AM

## 2018-01-22 NOTE — Progress Notes (Signed)
Patient confused and combative overnight. Vitals stable.

## 2018-01-22 NOTE — Discharge Summary (Signed)
Physician Discharge Summary  Rachael Hudson NUU:[id number] DOB: September 29, 1923 DOA: 01/18/2018  PCP: Keane Police, MD  Admit date: 01/18/2018 Discharge date: 01/22/2018  Admitted From: Home Disposition:  Home  Discharge Condition:Stable CODE STATUS:FULL, DNR, Comfort Care Diet recommendation: Heart Healthy / Carb Modified / Regular / Dysphagia   Brief/Interim Summary:  Patient is a 84 year old female with past medical history of advanced Alzheimer's dementia, hypertension who was brought from skilled nursing facility after a fall.  Patient complained of hip pain and had deformity following the fall.  She was found to have left-sided acute comminuted intertrochanteric fracture.  Orthopedics was consulted and she underwent left hip intertrochanteric nailing .  Her urine culture also grew E. Coli.  Started on antibiotics. Patient is medically stable to be discharged to skilled nursing facility today.  Following problems were addressed during hospitalization:  Left hip fracture: Found to have acute comminuted intertrochanteric fracture.  Underwent left hip intertrochanteric nailing.  Orthopedics were following.  On aspirin for DVT prophylaxis.  Continue pain management.  She will be discharged back to skilled nursing facility today. She will follow-up with orthopedics as an outpatient.  Urinary tract infection: Unclear if patient is having symptoms of dysuria.  Urine cultures grew Ecoli.  On keflex.  Acute blood loss anemia: Most likely secondary to hip fracture and surgery.  Hemoglobin was found to be 7.4.  Her hemoglobin on presentation was 12.9.  We  transfused her with 1 unit of PRBC. Today Hb is 9.1.  Dementia: Continue her home meds.  Continue supportive care  Hypertension: Currently her blood pressure stable.  Continue her home meds  GERD: Continue PPI.     Discharge Diagnoses:  Principal Problem:   Hip fracture (HCC) Active Problems:   Alzheimer's dementia    HTN (hypertension)   Urinary tract infection without hematuria    Discharge Instructions  Discharge Instructions    Diet - low sodium heart healthy   Complete by:  As directed    Discharge instructions   Complete by:  As directed    1) Take prescribed medications as instructed. 2)Follow up with orthopedics as an outpatient. Name and number of the provider has been attached. 3)Do a CBC and BMP tests in a week.   Increase activity slowly   Complete by:  As directed      Allergies as of 01/22/2018      Reactions   Fluoxetine Other (See Comments)   Unknown - unable to confirm via MAR   Sulfa Antibiotics Other (See Comments)   Unknown - unable to confirm via [facility]      Medication List    STOP taking these medications   traMADol 50 MG tablet Commonly known as:  ULTRAM     TAKE these medications   acetaminophen 325 MG tablet Commonly known as:  TYLENOL Take 650 mg by mouth QID.   aspirin 325 MG EC tablet Take 1 tablet (325 mg total) by mouth daily with breakfast.   cephALEXin 500 MG capsule Commonly known as:  KEFLEX Take 1 capsule (500 mg total) by mouth 2 (two) times daily for 3 days.   citalopram 20 MG tablet Commonly known as:  CELEXA Take 20 mg by mouth daily.   divalproex 125 MG capsule Commonly known as:  DEPAKOTE SPRINKLE Take 125 mg by mouth daily at 2 PM.   donepezil 10 MG tablet Commonly known as:  ARICEPT Take 10 mg by mouth at bedtime.   hydrochlorothiazide 12.5 MG capsule Commonly known as:  MICROZIDE Take 12.5 mg by mouth daily.   HYDROcodone-acetaminophen 5-325 MG tablet Commonly known as:  NORCO/VICODIN Take 1-2 tablets by mouth every 4 (four) hours as needed for moderate pain (pain score 4-6).   lisinopril 20 MG tablet Commonly known as:  PRINIVIL,ZESTRIL Take 20 mg by mouth daily.   NON FORMULARY Apply 1 mg topically 3 (three) times daily. Lorazepam 1mg /mL cream   pantoprazole 40 MG tablet Commonly known as:  PROTONIX Take 40 mg by  mouth daily.   risperiDONE 0.25 MG tablet Commonly known as:  RISPERDAL Take 0.25 mg by mouth at bedtime.   senna-docusate 8.6-50 MG tablet Commonly known as:  Senokot-S Take 1 tablet by mouth at bedtime.       Contact information for follow-up providers    Eldred Manges, MD. Schedule an appointment as soon as possible for a visit.   Specialty:  Orthopedic Surgery Why:  need return office 2 weeks postop Contact information: [address] [place] [address] [phone]            Contact information for after-discharge care    Destination    [facility] .   Service:  Skilled Nursing Contact information: [address] [place] [address] [phone]                 Allergies  Allergen Reactions  . Fluoxetine Other (See Comments)    Unknown - unable to confirm via MAR  . Sulfa Antibiotics Other (See Comments)    Unknown - unable to confirm via [facility]    Consultations: Orthopedics  Procedures/Studies: Dg Chest 1 View  Result Date: 01/18/2018 CLINICAL DATA:  Left hip fracture, preop. EXAM: CHEST  1 VIEW COMPARISON:  None. FINDINGS: Low lung volumes leading to bronchovascular crowding and bibasilar atelectasis. Heart size is normal for technique. Aortic arch atherosclerosis. No confluent consolidation, pneumothorax or large pleural effusion. Possible remote distal right clavicle fracture, suboptimally assessed by positioning. IMPRESSION: Low lung volumes with bronchovascular crowding and bibasilar atelectasis. Aortic Atherosclerosis (ICD10-I70.0). Electronically Signed   By: Rubye Oaks M.D.   On: 01/18/2018 22:50   Dg C-arm 1-60 Min-no Report  Result Date: 01/19/2018 Fluoroscopy was utilized by the requesting physician.  No radiographic interpretation.   Dg Hip Operative Unilat W Or W/o Pelvis Left  Result Date: 01/19/2018 CLINICAL DATA:  Left hip fracture.  Repair. FLUOROSCOPY TIME:  53  seconds. Images: 4 EXAM: OPERATIVE LEFT HIP (WITH PELVIS IF PERFORMED) 4 VIEWS TECHNIQUE: Fluoroscopic spot image(s) were submitted for interpretation post-operatively. COMPARISON:  January 18, 2017 FINDINGS: Intramedullary rod has been placed. A screw and a gamma nail cross the intertrochanteric fracture. A distal interlocking screw is noted. No dislocation. IMPRESSION: Left hip fracture repair as above. Electronically Signed   By: Gerome Sam III M.D   On: 01/19/2018 21:26   Dg Hip Unilat W Or Wo Pelvis 2-3 Views Left  Result Date: 01/18/2018 CLINICAL DATA:  Left hip pain EXAM: DG HIP (WITH OR WITHOUT PELVIS) 2-3V LEFT COMPARISON:  None. FINDINGS: Degenerative changes of the lumbar spine and SI joints. Partially visualized hardware in the proximal right femur. Pubic symphysis and rami are intact. Acute comminuted intertrochanteric fracture with displaced lesser trochanteric fracture fragment. There is questionable step-off deformity at the left femoral head neck junction IMPRESSION: 1. Acute comminuted and displaced intertrochanteric fracture. 2. Questionable step-off deformity/impacted fracture at the left femoral head neck junction. Electronically Signed   By: Adrian Prows.D.  On: 01/18/2018 22:20       Subjective: Patient seen and examined the bedside this morning.  Remains comfortable.  No new issues/events.  Hemodynamically  stable.  Stable for discharge to skilled nursing facility today.   Discharge Exam: Vitals:   01/22/18 0107 01/22/18 0522  BP: (!) 145/91 (!) 153/66  Pulse: 78 77  Resp: 16 18  Temp: 98 F (36.7 C) 98.1 F (36.7 C)  SpO2: 100% 100%   Vitals:   01/21/18 1547 01/21/18 2122 01/22/18 0107 01/22/18 0522  BP: (!) 108/56 (!) 149/66 (!) 145/91 (!) 153/66  Pulse: (!) 123 80 78 77  Resp: 18 16 16 18   Temp: 97.7 F (36.5 C) 98.6 F (37 C) 98 F (36.7 C) 98.1 F (36.7 C)  TempSrc: Oral Oral Oral Oral  SpO2: (!) 89% 100% 100% 100%    General: Pt is alert,  awake, not in acute distress Cardiovascular: RRR, S1/S2 +, no rubs, no gallops Respiratory: CTA bilaterally, no wheezing, no rhonchi Abdominal: Soft, NT, ND, bowel sounds + Extremities: no edema, no cyanosis    The results of significant diagnostics from this hospitalization (including imaging, microbiology, ancillary and laboratory) are listed below for reference.     Microbiology: Recent Results (from the past 240 hour(s))  Urine culture     Status: Abnormal   Collection Time: 01/18/18 11:18 PM  Result Value Ref Range Status   Specimen Description   Final    URINE, CATHETERIZED Performed at [facility], [address] [place], [place] [address]    Special Requests   Final    NONE Performed at [facility], [address] [place], [place] [address]    Culture >=100,000 COLONIES/mL ESCHERICHIA COLI (A)  Final   Report Status 01/22/2018 FINAL  Final   Organism ID, Bacteria ESCHERICHIA COLI (A)  Final      Susceptibility   Escherichia coli - MIC*    AMPICILLIN >=32 RESISTANT Resistant     CEFAZOLIN <=4 SENSITIVE Sensitive     CEFTRIAXONE <=1 SENSITIVE Sensitive     CIPROFLOXACIN >=4 RESISTANT Resistant     GENTAMICIN <=1 SENSITIVE Sensitive     IMIPENEM 1 SENSITIVE Sensitive     NITROFURANTOIN <=16 SENSITIVE Sensitive     TRIMETH/SULFA >=320 RESISTANT Resistant     AMPICILLIN/SULBACTAM >=32 RESISTANT Resistant     PIP/TAZO <=4 SENSITIVE Sensitive     Extended ESBL NEGATIVE Sensitive     * >=100,000 COLONIES/mL ESCHERICHIA COLI  Surgical pcr screen     Status: None   Collection Time: 01/19/18  2:56 PM  Result Value Ref Range Status   MRSA, PCR NEGATIVE NEGATIVE Final   Staphylococcus aureus NEGATIVE NEGATIVE Final    Comment: (NOTE) The Xpert SA Assay (FDA approved for NASAL specimens in patients 72 years of age and older), is one component of a comprehensive surveillance program. It is not intended to diagnose infection nor to guide or monitor  treatment. Performed at [facility], [address], [place], [place] [address]      Labs: BNP (last 3 results) No results for input(s): BNP in the last 8760 hours. Basic Metabolic Panel: Recent Labs  Lab 01/18/18 2057 01/20/18 0457 01/21/18 0523 01/21/18 0810  NA 142 140 QUESTIONABLE RESULTS, RECOMMEND RECOLLECT TO VERIFY 138  K 3.7 4.6 QUESTIONABLE RESULTS, RECOMMEND RECOLLECT TO VERIFY 3.8  CL 105 106 QUESTIONABLE RESULTS, RECOMMEND RECOLLECT TO VERIFY 104  CO2 27 29 QUESTIONABLE RESULTS, RECOMMEND RECOLLECT  TO VERIFY 28  GLUCOSE 170* 160* QUESTIONABLE RESULTS, RECOMMEND RECOLLECT TO VERIFY 81  BUN 26* 42* QUESTIONABLE RESULTS, RECOMMEND RECOLLECT TO VERIFY 47*  CREATININE 0.80 1.08* QUESTIONABLE RESULTS, RECOMMEND RECOLLECT TO VERIFY 0.87  CALCIUM 8.6* 8.0* QUESTIONABLE RESULTS, RECOMMEND RECOLLECT TO VERIFY 7.8*   Liver Function Tests: Recent Labs  Lab 01/20/18 0457 01/21/18 0523 01/21/18 0810  AST 21 QUESTIONABLE RESULTS, RECOMMEND RECOLLECT TO VERIFY 28  ALT 11 QUESTIONABLE RESULTS, RECOMMEND RECOLLECT TO VERIFY 11  ALKPHOS 37* QUESTIONABLE RESULTS, RECOMMEND RECOLLECT TO VERIFY 34*  BILITOT 0.8 QUESTIONABLE RESULTS, RECOMMEND RECOLLECT TO VERIFY 0.5  PROT 5.6* QUESTIONABLE RESULTS, RECOMMEND RECOLLECT TO VERIFY 5.1*  ALBUMIN 3.1* QUESTIONABLE RESULTS, RECOMMEND RECOLLECT TO VERIFY 2.7*   No results for input(s): LIPASE, AMYLASE in the last 168 hours. No results for input(s): AMMONIA in the last 168 hours. CBC: Recent Labs  Lab 01/18/18 2057 01/20/18 0457 01/21/18 0523 01/21/18 0810 01/22/18 0511  WBC 9.2 7.3 QUESTIONABLE RESULTS, RECOMMEND RECOLLECT TO VERIFY 5.6 5.4  NEUTROABS 8.0*  --   --   --  3.8  HGB 12.9 8.8* QUESTIONABLE RESULTS, RECOMMEND RECOLLECT TO VERIFY 7.4* 9.1*  HCT 38.4 26.7* QUESTIONABLE RESULTS, RECOMMEND RECOLLECT TO VERIFY 22.1* 27.7*  MCV 94.8 95.4 QUESTIONABLE RESULTS, RECOMMEND RECOLLECT TO VERIFY 95.7 91.7  PLT  156 161 QUESTIONABLE RESULTS, RECOMMEND RECOLLECT TO VERIFY 139* 135*   Cardiac Enzymes: No results for input(s): CKTOTAL, CKMB, CKMBINDEX, TROPONINI in the last 168 hours. BNP: Invalid input(s): POCBNP CBG: No results for input(s): GLUCAP in the last 168 hours. D-Dimer No results for input(s): DDIMER in the last 72 hours. Hgb A1c Recent Labs    01/20/18 0457  HGBA1C 5.3   Lipid Profile No results for input(s): CHOL, HDL, LDLCALC, TRIG, CHOLHDL, LDLDIRECT in the last 72 hours. Thyroid function studies No results for input(s): TSH, T4TOTAL, T3FREE, THYROIDAB in the last 72 hours.  Invalid input(s): FREET3 Anemia work up No results for input(s): VITAMINB12, FOLATE, FERRITIN, TIBC, IRON, RETICCTPCT in the last 72 hours. Urinalysis    Component Value Date/Time   COLORURINE YELLOW 01/18/2018 2318   APPEARANCEUR HAZY (A) 01/18/2018 2318   LABSPEC >1.030 (H) 01/18/2018 2318   PHURINE 5.5 01/18/2018 2318   GLUCOSEU NEGATIVE 01/18/2018 2318   HGBUR SMALL (A) 01/18/2018 2318   BILIRUBINUR NEGATIVE 01/18/2018 2318   KETONESUR NEGATIVE 01/18/2018 2318   PROTEINUR NEGATIVE 01/18/2018 2318   NITRITE POSITIVE (A) 01/18/2018 2318   LEUKOCYTESUR NEGATIVE 01/18/2018 2318   Sepsis Labs Invalid input(s): PROCALCITONIN,  WBC,  LACTICIDVEN Microbiology Recent Results (from the past 240 hour(s))  Urine culture     Status: Abnormal   Collection Time: 01/18/18 11:18 PM  Result Value Ref Range Status   Specimen Description   Final    URINE, CATHETERIZED Performed at [facility] [place], [address] [place], [place] [address]    Special Requests   Final    NONE Performed at [facility] [place], [address] [place], [place] [address]    Culture >=100,000 COLONIES/mL ESCHERICHIA COLI (A)  Final   Report Status 01/22/2018 FINAL  Final   Organism ID, Bacteria ESCHERICHIA COLI (A)  Final      Susceptibility   Escherichia coli - MIC*    AMPICILLIN >=32 RESISTANT Resistant      CEFAZOLIN <=4 SENSITIVE Sensitive     CEFTRIAXONE <=1 SENSITIVE Sensitive     CIPROFLOXACIN >=4 RESISTANT Resistant     GENTAMICIN <=1 SENSITIVE Sensitive  IMIPENEM 1 SENSITIVE Sensitive     NITROFURANTOIN <=16 SENSITIVE Sensitive     TRIMETH/SULFA >=320 RESISTANT Resistant     AMPICILLIN/SULBACTAM >=32 RESISTANT Resistant     PIP/TAZO <=4 SENSITIVE Sensitive     Extended ESBL NEGATIVE Sensitive     * >=100,000 COLONIES/mL ESCHERICHIA COLI  Surgical pcr screen     Status: None   Collection Time: 01/19/18  2:56 PM  Result Value Ref Range Status   MRSA, PCR NEGATIVE NEGATIVE Final   Staphylococcus aureus NEGATIVE NEGATIVE Final    Comment: (NOTE) The Xpert SA Assay (FDA approved for NASAL specimens in patients 76 years of age and older), is one component of a comprehensive surveillance program. It is not intended to diagnose infection nor to guide or monitor treatment. Performed at [facility], [address], [place], [place] [address]     Please note: You were cared for by a hospitalist during your hospital stay. Once you are discharged, your primary care physician will handle any further medical issues. Please note that NO REFILLS for any discharge medications will be authorized once you are discharged, as it is imperative that you return to your primary care physician (or establish a relationship with a primary care physician if you do not have one) for your post hospital discharge needs so that they can reassess your need for medications and monitor your lab values.    Time coordinating discharge: 40 minutes  SIGNED:   Burnadette Pop, MD  [facility] 01/22/2018, 8:37 AM Pager [phone]  If 7PM-7AM, please contact night-coverage [email or web address] Password [email or web address]

## 2018-01-23 ENCOUNTER — Telehealth (INDEPENDENT_AMBULATORY_CARE_PROVIDER_SITE_OTHER): Payer: Self-pay | Admitting: Orthopaedic Surgery

## 2018-01-23 NOTE — Telephone Encounter (Signed)
Please advise 

## 2018-01-23 NOTE — Telephone Encounter (Signed)
Marchelle Folksmanda from [facility] called needing weight bearing status for the patient. The number to contact Marchelle Folksmanda is [phone]  Fax # IS [phone]

## 2018-01-23 NOTE — Telephone Encounter (Signed)
WBAT thanks 

## 2018-01-24 NOTE — Telephone Encounter (Signed)
I left voicemail for Rachael Hudson advising.

## 2018-01-29 ENCOUNTER — Ambulatory Visit (INDEPENDENT_AMBULATORY_CARE_PROVIDER_SITE_OTHER): Payer: Medicare Other | Admitting: Surgery

## 2018-01-29 ENCOUNTER — Telehealth (INDEPENDENT_AMBULATORY_CARE_PROVIDER_SITE_OTHER): Payer: Self-pay

## 2018-01-29 ENCOUNTER — Ambulatory Visit (INDEPENDENT_AMBULATORY_CARE_PROVIDER_SITE_OTHER): Payer: Medicare Other

## 2018-01-29 ENCOUNTER — Encounter (INDEPENDENT_AMBULATORY_CARE_PROVIDER_SITE_OTHER): Payer: Self-pay | Admitting: Surgery

## 2018-01-29 DIAGNOSIS — S72002A Fracture of unspecified part of neck of left femur, initial encounter for closed fracture: Secondary | ICD-10-CM

## 2018-01-29 NOTE — Progress Notes (Signed)
84 year old white female with history of Alzheimer's disease and status post ORIF left hip fracture January 11, 2018 returns.  Patient is accompanied during her visit.  I did speak with supervisor Jae DireKate at the facility she is currently at and facility had gotten an x-ray of the left hip due to question of an injury to the left hip.  They did not witness a fall.  Stated the patient had been rubbing the left side of her leg and thought that she may have had an injury.  Family member who is present was not aware of the x-ray that was taken was a concern of a possible injury.  Exam Today hip staples removed and Steri-Strips applied.  Incision is healing well without signs of infection.   Plan Patient will continue PT protocol at the facility.  Weightbearing restrictions per Dr. Marlene BastYates's order.  Follow-up in 4 weeks for recheck with repeat x-ray.  All questions answered.

## 2018-01-29 NOTE — Telephone Encounter (Signed)
Rachael Hudsonwith [facility]had called on 01/29/18 to see if Rachael Hudson needed to be transported to our office for her appointment with Zonia KiefJames Owens, PA on 01/29/18, being that Rachael Hudson had a change in her condition.  Rachael Hudsonstated that Rachael Hudson may have re-injured her left hip. Rachael Hudson's x-ray report was faxed to our office for Zonia KiefJames Owens to review and he stated that he could not make that decision for Rachael Hudson's transport and it would be up to the facility to decide.  Zonia KiefJames Owens reviewed her report, but could not see any x-rays.  I called and left a message for Jae DireKate, Merchandiser, retailsupervisor at [facility].of message above per Zonia KiefJames Owens, PA.

## 2018-02-26 ENCOUNTER — Ambulatory Visit (INDEPENDENT_AMBULATORY_CARE_PROVIDER_SITE_OTHER): Payer: Medicare Other | Admitting: Orthopaedic Surgery

## 2018-05-12 ENCOUNTER — Emergency Department (HOSPITAL_COMMUNITY): Payer: Medicare Other

## 2018-05-12 ENCOUNTER — Other Ambulatory Visit: Payer: Self-pay

## 2018-05-12 ENCOUNTER — Encounter (HOSPITAL_COMMUNITY): Payer: Self-pay

## 2018-05-12 ENCOUNTER — Emergency Department (HOSPITAL_COMMUNITY)
Admission: EM | Admit: 2018-05-12 | Discharge: 2018-05-13 | Disposition: A | Discharge status: Home / Self Care | Payer: Medicare Other | Attending: Emergency Medicine | Admitting: Emergency Medicine

## 2018-05-12 DIAGNOSIS — W050XXA Fall from non-moving wheelchair, initial encounter: Secondary | ICD-10-CM | POA: Diagnosis not present

## 2018-05-12 DIAGNOSIS — S0083XA Contusion of other part of head, initial encounter: Secondary | ICD-10-CM

## 2018-05-12 DIAGNOSIS — Z96641 Presence of right artificial hip joint: Secondary | ICD-10-CM | POA: Insufficient documentation

## 2018-05-12 DIAGNOSIS — Y999 Unspecified external cause status: Secondary | ICD-10-CM | POA: Diagnosis not present

## 2018-05-12 DIAGNOSIS — S51002A Unspecified open wound of left elbow, initial encounter: Secondary | ICD-10-CM | POA: Insufficient documentation

## 2018-05-12 DIAGNOSIS — G309 Alzheimer's disease, unspecified: Secondary | ICD-10-CM | POA: Diagnosis not present

## 2018-05-12 DIAGNOSIS — S0081XA Abrasion of other part of head, initial encounter: Secondary | ICD-10-CM | POA: Diagnosis not present

## 2018-05-12 DIAGNOSIS — I10 Essential (primary) hypertension: Secondary | ICD-10-CM | POA: Insufficient documentation

## 2018-05-12 DIAGNOSIS — Z7982 Long term (current) use of aspirin: Secondary | ICD-10-CM | POA: Diagnosis not present

## 2018-05-12 DIAGNOSIS — S51019A Laceration without foreign body of unspecified elbow, initial encounter: Secondary | ICD-10-CM

## 2018-05-12 DIAGNOSIS — S0993XA Unspecified injury of face, initial encounter: Secondary | ICD-10-CM | POA: Diagnosis present

## 2018-05-12 DIAGNOSIS — N390 Urinary tract infection, site not specified: Secondary | ICD-10-CM | POA: Diagnosis not present

## 2018-05-12 DIAGNOSIS — Y939 Activity, unspecified: Secondary | ICD-10-CM | POA: Diagnosis not present

## 2018-05-12 DIAGNOSIS — Y92128 Other place in nursing home as the place of occurrence of the external cause: Secondary | ICD-10-CM | POA: Diagnosis not present

## 2018-05-12 DIAGNOSIS — Z79899 Other long term (current) drug therapy: Secondary | ICD-10-CM | POA: Insufficient documentation

## 2018-05-12 DIAGNOSIS — F0280 Dementia in other diseases classified elsewhere without behavioral disturbance: Secondary | ICD-10-CM | POA: Diagnosis not present
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
## 2018-05-12 NOTE — ED Notes (Signed)
Patient transported to XR-CT 
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
## 2018-05-13 DIAGNOSIS — S0081XA Abrasion of other part of head, initial encounter: Secondary | ICD-10-CM | POA: Diagnosis not present

## 2018-05-13 LAB — URINALYSIS, ROUTINE W REFLEX MICROSCOPIC
BILIRUBIN URINE: NEGATIVE
Glucose, UA: NEGATIVE mg/dL
Ketones, ur: NEGATIVE mg/dL
NITRITE: POSITIVE — AB
PROTEIN: NEGATIVE mg/dL
Specific Gravity, Urine: 1.013 (ref 1.005–1.030)
pH: 8 (ref 5.0–8.0)

## 2018-05-13 MED ORDER — CEPHALEXIN 500 MG PO CAPS: 500.0000 mg | ORAL_CAPSULE | Freq: Two times a day (BID) | ORAL | 0 refills | Status: DC

## 2018-05-13 MED ORDER — LISINOPRIL 10 MG PO TABS
10.0000 mg | ORAL_TABLET | Freq: Once | ORAL | Status: AC
  Administered 2018-05-13: 10 mg via ORAL
  Filled 2018-05-13: qty 1

## 2018-05-13 MED ORDER — LIDOCAINE HCL 1 % IJ SOLN
INTRAMUSCULAR | Status: AC
  Administered 2018-05-13: 20 mL
  Filled 2018-05-13: qty 20

## 2018-05-13 MED ORDER — CEFTRIAXONE SODIUM 1 G IJ SOLR
1.0000 g | Freq: Once | INTRAMUSCULAR | Status: AC
  Administered 2018-05-13: 1 g via INTRAMUSCULAR
  Filled 2018-05-13: qty 10

## 2018-05-13 MED ORDER — HYDROCHLOROTHIAZIDE 12.5 MG PO CAPS
12.5000 mg | ORAL_CAPSULE | Freq: Once | ORAL | Status: AC
  Administered 2018-05-13: 12.5 mg via ORAL
  Filled 2018-05-13: qty 1

## 2018-05-13 NOTE — ED Notes (Signed)
[facility] called for transportation  

## 2018-05-13 NOTE — ED Notes (Signed)
Phone report given to Whole FoodsEzinne, Merchandiser, retailupervisor at [facility]. Ezinne made aware that pt refused BP medications. BP is improved but EDP stated to make sure pt gets normal BP medication dose when she gets back to facility.

## 2018-05-13 NOTE — ED Notes (Signed)
Patient refusing to drink water of take oral medications. EDP made aware.

## 2018-05-13 NOTE — ED Notes (Signed)
Attempted to get vital signs several times but patient grabs for the cuff and moves around.

## 2018-05-15 LAB — URINE CULTURE: Culture: >=100000 — AB

## 2018-05-16 ENCOUNTER — Telehealth: Payer: Self-pay | Admitting: Emergency Medicine

## 2018-05-16 NOTE — Telephone Encounter (Signed)
Post ED Visit - Positive Culture Follow-up  Culture report reviewed by antimicrobial stewardship pharmacist:  []  Enzo BiNathan Batchelder, Pharm.D. []  Celedonio MiyamotoJeremy Frens, Pharm.D., BCPS AQ-ID []  Garvin FilaMike Maccia, Pharm.D., BCPS []  Georgina PillionElizabeth Martin, Pharm.D., BCPS []  EvansvilleMinh Pham, [address] BoulevardPharm.D., BCPS, AAHIVP []  Estella HuskMichelle Turner, Pharm.D., BCPS, AAHIVP [x]  Lysle Pearlachel Rumbarger, PharmD, BCPS []  Phillips Climeshuy Dang, PharmD, BCPS []  Agapito GamesAlison Masters, PharmD, BCPS []  Verlan FriendsErin Deja, PharmD  Positive urine culture Treated with cephalexin, organism sensitive to the same and no further patient follow-up is required at this time.  Berle MullMiller, Achille Xiang 05/16/2018, 11:42 AM

## 2018-08-29 ENCOUNTER — Encounter (HOSPITAL_COMMUNITY): Payer: Self-pay

## 2018-08-29 ENCOUNTER — Emergency Department (HOSPITAL_COMMUNITY)
Admission: EM | Admit: 2018-08-29 | Discharge: 2018-08-30 | Disposition: A | Discharge status: Home / Self Care | Payer: Medicare Other | Attending: Emergency Medicine | Admitting: Emergency Medicine

## 2018-08-29 ENCOUNTER — Other Ambulatory Visit: Payer: Self-pay

## 2018-08-29 DIAGNOSIS — W500XXA Accidental hit or strike by another person, initial encounter: Secondary | ICD-10-CM | POA: Insufficient documentation

## 2018-08-29 DIAGNOSIS — S0083XA Contusion of other part of head, initial encounter: Secondary | ICD-10-CM | POA: Diagnosis present

## 2018-08-29 DIAGNOSIS — Y92129 Unspecified place in nursing home as the place of occurrence of the external cause: Secondary | ICD-10-CM | POA: Diagnosis not present

## 2018-08-29 DIAGNOSIS — Z79899 Other long term (current) drug therapy: Secondary | ICD-10-CM | POA: Insufficient documentation

## 2018-08-29 DIAGNOSIS — G309 Alzheimer's disease, unspecified: Secondary | ICD-10-CM | POA: Insufficient documentation

## 2018-08-29 DIAGNOSIS — F0280 Dementia in other diseases classified elsewhere without behavioral disturbance: Secondary | ICD-10-CM | POA: Diagnosis not present

## 2018-08-29 DIAGNOSIS — Y998 Other external cause status: Secondary | ICD-10-CM | POA: Diagnosis not present

## 2018-08-29 DIAGNOSIS — I10 Essential (primary) hypertension: Secondary | ICD-10-CM | POA: Insufficient documentation

## 2018-08-29 DIAGNOSIS — Y9389 Activity, other specified: Secondary | ICD-10-CM | POA: Insufficient documentation

## 2018-08-29 DIAGNOSIS — Z7982 Long term (current) use of aspirin: Secondary | ICD-10-CM | POA: Insufficient documentation
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
## 2018-08-29 NOTE — ED Notes (Signed)
Provider at bedside with pt and family at this time

## 2018-08-29 NOTE — Discharge Instructions (Addendum)
Continue home medications as previously prescribed. Return to ED for worsening symptoms, fever, injuries or falls.
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
## 2018-10-29 ENCOUNTER — Emergency Department (HOSPITAL_COMMUNITY): Payer: Medicare Other

## 2018-10-29 ENCOUNTER — Observation Stay (HOSPITAL_COMMUNITY): Payer: Medicare Other

## 2018-10-29 ENCOUNTER — Other Ambulatory Visit: Payer: Self-pay

## 2018-10-29 ENCOUNTER — Encounter (HOSPITAL_COMMUNITY): Payer: Self-pay

## 2018-10-29 ENCOUNTER — Inpatient Hospital Stay (HOSPITAL_COMMUNITY)
Admission: EM | Admit: 2018-10-29 | Discharge: 2018-11-04 | DRG: 871 | Disposition: A | Discharge status: Home / Self Care | Payer: Medicare Other | Source: Skilled Nursing Facility | Attending: Family Medicine | Admitting: Family Medicine

## 2018-10-29 DIAGNOSIS — Z8781 Personal history of (healed) traumatic fracture: Secondary | ICD-10-CM

## 2018-10-29 DIAGNOSIS — N3000 Acute cystitis without hematuria: Secondary | ICD-10-CM | POA: Diagnosis not present

## 2018-10-29 DIAGNOSIS — K8020 Calculus of gallbladder without cholecystitis without obstruction: Secondary | ICD-10-CM | POA: Diagnosis present

## 2018-10-29 DIAGNOSIS — A419 Sepsis, unspecified organism: Secondary | ICD-10-CM | POA: Diagnosis not present

## 2018-10-29 DIAGNOSIS — R6520 Severe sepsis without septic shock: Secondary | ICD-10-CM

## 2018-10-29 DIAGNOSIS — G2581 Restless legs syndrome: Secondary | ICD-10-CM | POA: Diagnosis present

## 2018-10-29 DIAGNOSIS — K8309 Other cholangitis: Secondary | ICD-10-CM | POA: Diagnosis present

## 2018-10-29 DIAGNOSIS — K7200 Acute and subacute hepatic failure without coma: Principal | ICD-10-CM

## 2018-10-29 DIAGNOSIS — F0281 Dementia in other diseases classified elsewhere with behavioral disturbance: Secondary | ICD-10-CM

## 2018-10-29 DIAGNOSIS — G9341 Metabolic encephalopathy: Secondary | ICD-10-CM | POA: Diagnosis present

## 2018-10-29 DIAGNOSIS — Z66 Do not resuscitate: Secondary | ICD-10-CM | POA: Diagnosis present

## 2018-10-29 DIAGNOSIS — R1310 Dysphagia, unspecified: Secondary | ICD-10-CM

## 2018-10-29 DIAGNOSIS — R1011 Right upper quadrant pain: Secondary | ICD-10-CM | POA: Diagnosis not present

## 2018-10-29 DIAGNOSIS — H18419 Arcus senilis, unspecified eye: Secondary | ICD-10-CM | POA: Diagnosis present

## 2018-10-29 DIAGNOSIS — Z20828 Contact with and (suspected) exposure to other viral communicable diseases: Secondary | ICD-10-CM | POA: Diagnosis present

## 2018-10-29 DIAGNOSIS — I517 Cardiomegaly: Secondary | ICD-10-CM

## 2018-10-29 DIAGNOSIS — Z7982 Long term (current) use of aspirin: Secondary | ICD-10-CM

## 2018-10-29 DIAGNOSIS — D696 Thrombocytopenia, unspecified: Secondary | ICD-10-CM | POA: Diagnosis present

## 2018-10-29 DIAGNOSIS — E8809 Other disorders of plasma-protein metabolism, not elsewhere classified: Secondary | ICD-10-CM | POA: Diagnosis present

## 2018-10-29 DIAGNOSIS — E860 Dehydration: Secondary | ICD-10-CM | POA: Diagnosis present

## 2018-10-29 DIAGNOSIS — R7989 Other specified abnormal findings of blood chemistry: Secondary | ICD-10-CM | POA: Diagnosis present

## 2018-10-29 DIAGNOSIS — N136 Pyonephrosis: Secondary | ICD-10-CM | POA: Diagnosis present

## 2018-10-29 DIAGNOSIS — I119 Hypertensive heart disease without heart failure: Secondary | ICD-10-CM | POA: Diagnosis present

## 2018-10-29 DIAGNOSIS — B9620 Unspecified Escherichia coli [E. coli] as the cause of diseases classified elsewhere: Secondary | ICD-10-CM | POA: Diagnosis present

## 2018-10-29 DIAGNOSIS — G9340 Encephalopathy, unspecified: Secondary | ICD-10-CM

## 2018-10-29 DIAGNOSIS — H9190 Unspecified hearing loss, unspecified ear: Secondary | ICD-10-CM | POA: Diagnosis present

## 2018-10-29 DIAGNOSIS — K819 Cholecystitis, unspecified: Secondary | ICD-10-CM | POA: Diagnosis not present

## 2018-10-29 DIAGNOSIS — Z888 Allergy status to other drugs, medicaments and biological substances status: Secondary | ICD-10-CM

## 2018-10-29 DIAGNOSIS — Z8249 Family history of ischemic heart disease and other diseases of the circulatory system: Secondary | ICD-10-CM

## 2018-10-29 DIAGNOSIS — Z96642 Presence of left artificial hip joint: Secondary | ICD-10-CM | POA: Diagnosis present

## 2018-10-29 DIAGNOSIS — I10 Essential (primary) hypertension: Secondary | ICD-10-CM | POA: Diagnosis present

## 2018-10-29 DIAGNOSIS — R54 Age-related physical debility: Secondary | ICD-10-CM | POA: Diagnosis present

## 2018-10-29 DIAGNOSIS — Z882 Allergy status to sulfonamides status: Secondary | ICD-10-CM

## 2018-10-29 DIAGNOSIS — G309 Alzheimer's disease, unspecified: Secondary | ICD-10-CM

## 2018-10-29 DIAGNOSIS — R945 Abnormal results of liver function studies: Secondary | ICD-10-CM

## 2018-10-29 DIAGNOSIS — Z87440 Personal history of urinary (tract) infections: Secondary | ICD-10-CM

## 2018-10-29 DIAGNOSIS — N390 Urinary tract infection, site not specified: Secondary | ICD-10-CM

## 2018-10-29 DIAGNOSIS — L89151 Pressure ulcer of sacral region, stage 1: Secondary | ICD-10-CM | POA: Diagnosis present

## 2018-10-29 DIAGNOSIS — F0280 Dementia in other diseases classified elsewhere without behavioral disturbance: Secondary | ICD-10-CM | POA: Diagnosis present

## 2018-10-29 DIAGNOSIS — Z79899 Other long term (current) drug therapy: Secondary | ICD-10-CM

## 2018-10-29 LAB — URINALYSIS, ROUTINE W REFLEX MICROSCOPIC
Bilirubin Urine: NEGATIVE
Glucose, UA: NEGATIVE mg/dL
Ketones, ur: NEGATIVE mg/dL
Nitrite: POSITIVE — AB
Protein, ur: 30 mg/dL — AB
Specific Gravity, Urine: 1.017 (ref 1.005–1.030)
WBC, UA: >50 WBC/hpf — ABNORMAL HIGH (ref 0–5)
pH: 6 (ref 5.0–8.0)

## 2018-10-29 LAB — SARS CORONAVIRUS 2 BY RT PCR (HOSPITAL ORDER, PERFORMED IN ~~LOC~~ HOSPITAL LAB): SARS Coronavirus 2: NEGATIVE

## 2018-10-29 LAB — COMPREHENSIVE METABOLIC PANEL
ALT: 589 U/L — ABNORMAL HIGH (ref 0–44)
AST: 269 U/L — ABNORMAL HIGH (ref 15–41)
Albumin: 3.1 g/dL — ABNORMAL LOW (ref 3.5–5.0)
Alkaline Phosphatase: 249 U/L — ABNORMAL HIGH (ref 38–126)
Anion gap: 8 (ref 5–15)
BUN: 19 mg/dL (ref 8–23)
CO2: 27 mmol/L (ref 22–32)
Calcium: 8.3 mg/dL — ABNORMAL LOW (ref 8.9–10.3)
Chloride: 102 mmol/L (ref 98–111)
Creatinine, Ser: 0.59 mg/dL (ref 0.44–1.00)
GFR calc Af Amer: >60 mL/min (ref >60)
GFR calc non Af Amer: >60 mL/min (ref >60)
Glucose, Bld: 108 mg/dL — ABNORMAL HIGH (ref 70–99)
Potassium: 4.3 mmol/L (ref 3.5–5.1)
Sodium: 137 mmol/L (ref 135–145)
Total Bilirubin: 1.6 mg/dL — ABNORMAL HIGH (ref 0.3–1.2)
Total Protein: 6.4 g/dL — ABNORMAL LOW (ref 6.5–8.1)

## 2018-10-29 LAB — CBC WITH DIFFERENTIAL/PLATELET
Abs Immature Granulocytes: 0.02 10*3/uL (ref 0.00–0.07)
Basophils Absolute: 0 10*3/uL (ref 0.0–0.1)
Basophils Relative: 1 %
Eosinophils Absolute: 0 10*3/uL (ref 0.0–0.5)
Eosinophils Relative: 0 %
HCT: 40.5 % (ref 36.0–46.0)
Hemoglobin: 13.4 g/dL (ref 12.0–15.0)
Immature Granulocytes: 0 %
Lymphocytes Relative: 7 %
Lymphs Abs: 0.3 10*3/uL — ABNORMAL LOW (ref 0.7–4.0)
MCH: 31.9 pg (ref 26.0–34.0)
MCHC: 33.1 g/dL (ref 30.0–36.0)
MCV: 96.4 fL (ref 80.0–100.0)
Monocytes Absolute: 0.3 10*3/uL (ref 0.1–1.0)
Monocytes Relative: 6 %
Neutro Abs: 4.3 10*3/uL (ref 1.7–7.7)
Neutrophils Relative %: 86 %
Platelets: 91 10*3/uL — ABNORMAL LOW (ref 150–400)
RBC: 4.2 MIL/uL (ref 3.87–5.11)
RDW: 13.8 % (ref 11.5–15.5)
WBC: 5 10*3/uL (ref 4.0–10.5)
nRBC: 0 % (ref 0.0–0.2)

## 2018-10-29 LAB — PROTIME-INR
INR: 1.2 (ref 0.8–1.2)
Prothrombin Time: 14.8 seconds (ref 11.4–15.2)

## 2018-10-29 LAB — ACETAMINOPHEN LEVEL: Acetaminophen (Tylenol), Serum: 14 ug/mL (ref 10–30)

## 2018-10-29 LAB — LACTIC ACID, PLASMA
Lactic Acid, Venous: 1.1 mmol/L (ref 0.5–1.9)
Lactic Acid, Venous: 0.7 mmol/L (ref 0.5–1.9)

## 2018-10-29 LAB — BRAIN NATRIURETIC PEPTIDE: B Natriuretic Peptide: 129.5 pg/mL — ABNORMAL HIGH (ref 0.0–100.0)

## 2018-10-29 LAB — VALPROIC ACID LEVEL: Valproic Acid Lvl: <10 ug/mL — ABNORMAL LOW (ref 50.0–100.0)

## 2018-10-29 MED ORDER — PIPERACILLIN-TAZOBACTAM 3.375 G IVPB
3.3750 g | Freq: Three times a day (TID) | INTRAVENOUS | Status: DC
  Administered 2018-10-30 (×2): 3.375 g via INTRAVENOUS
  Filled 2018-10-29 (×2): qty 50

## 2018-10-29 MED ORDER — SODIUM CHLORIDE 0.9 % IV SOLN
INTRAVENOUS | Status: AC
  Administered 2018-10-30: 01:00:00 via INTRAVENOUS

## 2018-10-29 MED ORDER — SODIUM CHLORIDE 0.9 % IV SOLN: 1.0000 g | INTRAVENOUS | Status: DC

## 2018-10-29 MED ORDER — SODIUM CHLORIDE 0.9 % IV BOLUS
1000.0000 mL | Freq: Once | INTRAVENOUS | Status: AC
  Administered 2018-10-29: 1000 mL via INTRAVENOUS

## 2018-10-29 MED ORDER — SODIUM CHLORIDE 0.9 % IV SOLN
1.0000 g | Freq: Once | INTRAVENOUS | Status: AC
  Administered 2018-10-29: 1 g via INTRAVENOUS
  Filled 2018-10-29: qty 10

## 2018-10-29 MED ORDER — ACETAMINOPHEN 650 MG RE SUPP
650.0000 mg | Freq: Once | RECTAL | Status: AC
  Administered 2018-10-29: 650 mg via RECTAL
  Filled 2018-10-29: qty 1

## 2018-10-29 MED ORDER — IOHEXOL 300 MG/ML  SOLN
100.0000 mL | Freq: Once | INTRAMUSCULAR | Status: AC | PRN
  Administered 2018-10-29: 100 mL via INTRAVENOUS

## 2018-10-29 MED ORDER — ACETAMINOPHEN 325 MG PO TABS: 650.0000 mg | ORAL_TABLET | Freq: Once | ORAL | Status: DC

## 2018-10-29 MED ORDER — SODIUM CHLORIDE (PF) 0.9 % IJ SOLN
INTRAMUSCULAR | Status: AC
  Administered 2018-10-30: 05:00:00
  Filled 2018-10-29: qty 50

## 2018-10-29 NOTE — H&P (Signed)
Rachael Hudson IPJ:[id number] DOB: 27-Oct-1923 DOA: 10/29/2018     PCP: Keane Police, MD   Outpatient Specialists: ortho Dr. Ophelia Charter Patient arrived to ER on 10/29/18 at 1822  Patient coming from:   From facility [facility].  Chief Complaint:  Chief Complaint  Patient presents with  . Fever  . Dementia    HPI: Rachael Hudson is a 85 y.o. female with medical history significant of dementia frequent falls status post hip replacement, anemia, essential hypertension    Presented with fever up to 103 started today no cough or otherwise any other symptoms per staff patient has dementia unable to provide her own history.  The patient does not endorse any cough or shortness of breath Patient did have a strong smell of urine on arrival.  Infectious risk factors:  Reports fever,      Regarding pertinent Chronic problems: History of hypertension for which she takes hydrochlorothiazide and lisinopril History of dementia for which she takes Aricept and Depakote for mood stabilization Frequent UTIs in the past treated with Keflex last time in November  While in ER: On arrival to ER noted to be having temperature up to 101.7 blood pressure elevated 188/86, UA worrisome for UTI But also noted to have low platelets down to 91 And elevated LFTs Cultures blood obtained acute hepatitis panel obtained acetaminophen level checked Patient was started on ceftriaxone and given IV fluid bolus COVID-19 negative Chest x-ray nonspecific showing mild vascular congestion  The following Work up has been ordered so far:  Orders Placed This Encounter  Procedures  . Blood culture (routine x 2)  . SARS Coronavirus 2 (CEPHEID - Performed in [facility] hospital lab), [facility]  . Urine culture  . DG Chest Port 1 View  . CT ABDOMEN PELVIS W CONTRAST  . CT Head Wo Contrast  . CBC with Differential/Platelet  . Comprehensive metabolic panel  . Lactic acid, plasma  . Urinalysis,  Routine w reflex microscopic  . Valproic acid level  . Hepatitis panel, acute  . Protime-INR  . Acetaminophen level  . In and Out Cath  . Consult to hospitalist     Following Medications were ordered in ER: Medications  cefTRIAXone (ROCEPHIN) 1 g in sodium chloride 0.9 % 100 mL IVPB (has no administration in time range)  iohexol (OMNIPAQUE) 300 MG/ML solution 100 mL (has no administration in time range)  sodium chloride (PF) 0.9 % injection (has no administration in time range)  sodium chloride 0.9 % bolus 1,000 mL (0 mLs Intravenous Stopped 10/29/18 2123)  acetaminophen (TYLENOL) suppository 650 mg (650 mg Rectal Given 10/29/18 2002)        Consult Orders  (From admission, onward)         Start     Ordered   10/29/18 2124  Consult to hospitalist  Once    Provider:  Therisa Doyne, MD  Question Answer Comment  Place call to: [facility]   Reason for Consult Admit      10/29/18 2123            Significant initial  Findings: Abnormal Labs Reviewed  CBC WITH DIFFERENTIAL/PLATELET - Abnormal; Notable for the following components:      Result Value   Platelets 91 (*)    Lymphs Abs 0.3 (*)    All other components within normal limits  COMPREHENSIVE METABOLIC PANEL - Abnormal; Notable for the following components:   Glucose, Bld 108 (*)    Calcium 8.3 (*)  Total Protein 6.4 (*)    Albumin 3.1 (*)    AST 269 (*)    ALT 589 (*)    Alkaline Phosphatase 249 (*)    Total Bilirubin 1.6 (*)    All other components within normal limits  URINALYSIS, ROUTINE W REFLEX MICROSCOPIC - Abnormal; Notable for the following components:   Color, Urine AMBER (*)    APPearance CLOUDY (*)    Hgb urine dipstick MODERATE (*)    Protein, ur 30 (*)    Nitrite POSITIVE (*)    Leukocytes,Ua LARGE (*)    WBC, UA >50 (*)    Bacteria, UA MANY (*)    All other components within normal limits  VALPROIC ACID LEVEL - Abnormal; Notable for the following components:   Valproic Acid  Lvl <10 (*)    All other components within normal limits     Otherwise labs showing:    Recent Labs  Lab 10/29/18 1918  NA 137  K 4.3  CO2 27  GLUCOSE 108*  BUN 19  CREATININE 0.59  CALCIUM 8.3*    Cr    stable,    Lab Results  Component Value Date   CREATININE 0.59 10/29/2018   CREATININE 0.87 01/21/2018   CREATININE  01/21/2018    QUESTIONABLE RESULTS, RECOMMEND RECOLLECT TO VERIFY    Recent Labs  Lab 10/29/18 1918  AST 269*  ALT 589*  ALKPHOS 249*  BILITOT 1.6*  PROT 6.4*  ALBUMIN 3.1*      WBC       Component Value Date/Time   WBC 5.0 10/29/2018 1918      Component Value Date/Time   NEUTROABS 4.3 10/29/2018 1918      Lactic Acid, Venous    Component Value Date/Time   LATICACIDVEN 1.1 10/29/2018 1912      HG/HCT  stable,      Component Value Date/Time   HGB 13.4 10/29/2018 1918   HCT 40.5 10/29/2018 1918     DM  labs:  HbA1C: Recent Labs    01/20/18 0457  HGBA1C 5.3     CBG: No results for input(s): GLUCAP in the last 168 hours.    UA  evidence of UTI     Urine analysis:    Component Value Date/Time   COLORURINE AMBER (A) 10/29/2018 2010   APPEARANCEUR CLOUDY (A) 10/29/2018 2010   LABSPEC 1.017 10/29/2018 2010   PHURINE 6.0 10/29/2018 2010   GLUCOSEU NEGATIVE 10/29/2018 2010   HGBUR MODERATE (A) 10/29/2018 2010   BILIRUBINUR NEGATIVE 10/29/2018 2010   KETONESUR NEGATIVE 10/29/2018 2010   PROTEINUR 30 (A) 10/29/2018 2010   NITRITE POSITIVE (A) 10/29/2018 2010   LEUKOCYTESUR LARGE (A) 10/29/2018 2010      CT HEAD * NON acute  CXR -vascular congestion   ECG:  Personally reviewed by me showing: HR : 66 Rhythm:  NSR,     no evidence of ischemic changes QTC 456      ED Triage Vitals  Enc Vitals Group     BP 10/29/18 1843 (!) 168/79     Pulse Rate 10/29/18 1843 89     Resp 10/29/18 1843 13     Temp 10/29/18 1843 (!) 101.7 F (38.7 C)     Temp Source 10/29/18 1843 Rectal     SpO2 10/29/18 1832 91 %      Weight --      Height --      Head Circumference --      Peak Flow --  Pain Score 10/29/18 2115 Asleep     Pain Loc --      Pain Edu? --      Excl. in GC? --   TMAX(24)@       Latest  Blood pressure (!) 188/86, pulse 68, temperature (!) 101.7 F (38.7 C), temperature source Rectal, resp. rate 20, SpO2 99 %.     Hospitalist was called for admission for UTI resulting in sepsis   Review of Systems:    Pertinent positives include: Fevers, chills  Constitutional:  No weight loss, night sweats,, fatigue, weight loss  HEENT:  No headaches, Difficulty swallowing,Tooth/dental problems,Sore throat,  No sneezing, itching, ear ache, nasal congestion, post nasal drip,  Cardio-vascular:  No chest pain, Orthopnea, PND, anasarca, dizziness, palpitations.no Bilateral lower extremity swelling  GI:  No heartburn, indigestion, abdominal pain, nausea, vomiting, diarrhea, change in bowel habits, loss of appetite, melena, blood in stool, hematemesis Resp:  no shortness of breath at rest. No dyspnea on exertion, No excess mucus, no productive cough, No non-productive cough, No coughing up of blood.No change in color of mucus.No wheezing. Skin:  no rash or lesions. No jaundice GU:  no dysuria, change in color of urine, no urgency or frequency. No straining to urinate.  No flank pain.  Musculoskeletal:  No joint pain or no joint swelling. No decreased range of motion. No back pain.  Psych:  No change in mood or affect. No depression or anxiety. No memory loss.  Neuro: no localizing neurological complaints, no tingling, no weakness, no double vision, no gait abnormality, no slurred speech, no confusion  All systems reviewed and apart from HOPI all are negative  Past Medical History:   Past Medical History:  Diagnosis Date  . Alzheimer disease (HCC)   . Anemia   . HOH (hard of hearing)   . Hypertension   . Restless leg syndrome       Past Surgical History:  Procedure Laterality  Date  . INTRAMEDULLARY (IM) NAIL INTERTROCHANTERIC Left 01/19/2018   Procedure: INTRAMEDULLARY (IM) NAIL INTERTROCHANTRIC;  Surgeon: Eldred Manges, MD;  Location: [facility];  Service: Orthopedics;  Laterality: Left;  . JOINT REPLACEMENT    . right hip fracture      Social History:  Ambulatory     has an unknown smoking status. She has never used smokeless tobacco. She reports previous alcohol use. She reports previous drug use.     Family History:   Family History  Problem Relation Age of Onset  . Hypertension Other     Allergies: Allergies  Allergen Reactions  . Fluoxetine Other (See Comments)    Unknown - unable to confirm via MAR  . Sulfa Antibiotics Other (See Comments)    Unknown - unable to confirm via MAR     Prior to Admission medications   Medication Sig Start Date End Date Taking? Authorizing Provider  acetaminophen (TYLENOL) 325 MG tablet Take 650 mg by mouth QID.   Yes [provider]  aspirin (ASPIRIN 81) 81 MG chewable tablet Chew 81 mg by mouth daily.   Yes [provider]  citalopram (CELEXA) 20 MG tablet Take 20 mg by mouth daily.   Yes [provider]  divalproex (DEPAKOTE SPRINKLE) 125 MG capsule Take 125 mg by mouth 2 (two) times daily.    Yes [provider]  donepezil (ARICEPT) 10 MG tablet Take 10 mg by mouth at bedtime.   Yes [provider]  hydrochlorothiazide (MICROZIDE) 12.5 MG capsule Take 12.5 mg  by mouth daily.   Yes [provider]  lisinopril (PRINIVIL,ZESTRIL) 20 MG tablet Take 20 mg by mouth daily. 12/24/17  Yes [provider]  LORazepam (ATIVAN) 1 MG tablet Take 1 mg by mouth daily as needed for anxiety or agitation. 04/28/18  Yes [provider]  multivitamin-lutein (OCUVITE-LUTEIN) CAPS capsule Take 1 capsule by mouth daily.   Yes [provider]  Nutritional Supplements (NUTRITIONAL SHAKE PLUS PO) Take 237 mLs by mouth 3 (three) times daily.   Yes [provider]  omeprazole (PRILOSEC) 20 MG capsule Take 20 mg by mouth daily. 05/12/18  Yes [provider]  risperiDONE (RISPERDAL) 0.25 MG tablet Take 0.25 mg by mouth at bedtime.   Yes [provider]  senna-docusate (SENOKOT-S) 8.6-50 MG tablet Take 1 tablet by mouth at bedtime.   Yes [provider]  aspirin EC 325 MG EC tablet Take 1 tablet (325 mg total) by mouth daily with breakfast. Patient not taking: Reported on 10/29/2018 01/21/18   Naida Sleight, PA-C  cephALEXin (KEFLEX) 500 MG capsule Take 1 capsule (500 mg total) by mouth 2 (two) times daily. Patient not taking: Reported on 10/29/2018 05/13/18   Gilda Crease, MD  HYDROcodone-acetaminophen (NORCO/VICODIN) 5-325 MG tablet Take 1-2 tablets by mouth every 4 (four) hours as needed for moderate pain (pain score 4-6). Patient not taking: Reported on 10/29/2018 01/20/18   Naida Sleight, PA-C   Physical Exam: Blood pressure (!) 188/86, pulse 68, temperature (!) 101.7 F (38.7 C), temperature source Rectal, resp. rate 20, SpO2 99 %. 1. General:  in No Acute distress  Chronically ill -appearing 2. Psychological: Somnolent, decrease ability to arouse although opens eyes to stimulus squeezes fingers 3. Head/ENT:  Dry Mucous Membranes                          Head Non traumatic, neck supple                           Poor Dentition 4. SKIN: decreased Skin turgor,  Skin clean Dry and intact no rash 5. Heart: Regular rate and rhythm no Murmur, no Rub or gallop 6. Lungs: no wheezes or crackles   7. Abdomen: Soft,  non-tender, Non distended  bowel sounds present 8. Lower extremities: no clubbing, cyanosis, trace edema 9. Neurologically Grossly intact, moving all 4 extremities equally   10. MSK: Normal range of motion   All other LABS:     Recent Labs  Lab 10/29/18 1918  WBC 5.0  NEUTROABS 4.3  HGB 13.4  HCT 40.5  MCV 96.4  PLT 91*     Recent Labs  Lab 10/29/18 1918  NA 137  K 4.3  CL 102   CO2 27  GLUCOSE 108*  BUN 19  CREATININE 0.59  CALCIUM 8.3*     Recent Labs  Lab 10/29/18 1918  AST 269*  ALT 589*  ALKPHOS 249*  BILITOT 1.6*  PROT 6.4*  ALBUMIN 3.1*       Cultures:    Component Value Date/Time   SDES  05/13/2018 0702    URINE, CATHETERIZED Performed at [facility], [address] [place], [place] [address]    SPECREQUEST  05/13/2018 6045    NONE Performed at [facility], [address] [place], [place] [address]    CULT >=100,000 COLONIES/mL ESCHERICHIA COLI (A) 05/13/2018 0702   REPTSTATUS 05/15/2018  FINAL 05/13/2018 0702     Radiological Exams on Admission: Dg Chest Port 1 View  Result Date: 10/29/2018 CLINICAL DATA:  Fever EXAM: PORTABLE CHEST 1 VIEW COMPARISON:  05/12/2018 FINDINGS: Cardiomegaly with vascular congestion. Elevation of the right hemidiaphragm. Bibasilar atelectasis with small effusions. No overt edema. No acute bony abnormality. IMPRESSION: Cardiomegaly with vascular congestion. Bibasilar atelectasis with small effusions. Electronically Signed   By: Charlett NoseKevin  Dover M.D.   On: 10/29/2018 19:35    Chart has been reviewed   Assessment/Plan  85 y.o. female with medical history significant of dementia frequent falls status post hip replacement, anemia, essential hypertension    Admitted for UTI  resulting in sepsis elevated LFTs, possible cholecystitis  Present on Admission: Sepsis -   -Patient meets sepsis criteria with  Fever , acute encephalopathy      Initial lactic acid Lactic Acid, Venous    Component Value Date/Time   LATICACIDVEN 0.7 10/29/2018 2140   Source most likely:   UTI/ inta abdominal infection  -We will rehydrate, treat with IV antibiotics, on Zosyn starting from 10/29/18 follow lactic acid - Await results of blood and urine culture and adjust antibiotics as needed   . Cholecystitis - CT abd worrisome for cholecystitis, as of elevated LFTs.  Right upper quadrant  ultrasound and HIDA scan ordered if further evidence of cholecystitis confirmed please reconsult general surgery Dr. Carolynne Edouardoth was notified by ER at night but would like to have call back if official consult needed.  For now continue with Zosyn for antibiotics discussed with family who at this point states although she is DO NOT RESUSCITATE DO NOT INTUBATE will be still interested in procedures or surgeries if medically necessary. . Alzheimer's dementia (HCC) chronic expect some degree of sundowning while hospitalized continue home medications if able to tolerate . HTN (hypertension) chronic stable hold hydrochlorothiazide as patient appears to have somewhat of a dry mucous membranes . Urinary tract infection without hematuria -await results of urine culture for now should be covered with IV antibiotics IV Zosyn for cholecystitis . Elevated LFTs -getting a right upper quadrant ultrasound to further evaluate, hepatitis serologies ordered, COVID-19 negative no other respiratory complaints.    . Acute metabolic encephalopathy -   - most likely multifactorial secondary to combination of  infection  mild dehydration secondary to decreased by mouth intake,    - Will rehydrate gently   - treat underlining infection   - Hold contributing medications     - neurological exam appears to be nonfocal but patient unable to cooperate fully   - VBG ordered    - no history of liver disease with elevated LFTs ordered ammonia  . Thrombocytopenia (HCC) -unclear etiology, no prior history of thrombocytopenia.  CT scan not consistent with cirrhosis although somewhat low albumin INR within normal limits. Will obtain right upper quadrant restart to further evaluate liver parenchyma. Check haptoglobin LDH and Coombs test further work-up depending on results  cardioMegaly -will be judicial with IV fluids, avoid fluid overload, benefit from a echogram at a later time pending further work-up, Peripheral edema could be secondary  to hypoalbuminemia  hypoalbuminemia  we will check prealbumin may benefit from nutritional consult, also evaluate liver parenchyma contributing to generalized peripheral edema  Other plan as per orders.  DVT prophylaxis:  SCD       Code Status:    DNR/DNI as per family  I had personally discussed CODE STATUS with family     Family Communication:   Family not  at  Bedside  discussed with son on the phone  Disposition Plan:                              Back to current facility when stable                                                Social Work  consulted                  Consults called: general surgery please call back if HIDA scan is positive  Admission status:  ED Disposition    None    Obs   Level of care    tele  For 24H     Precautions:  NONE  No active isolations No respiratory  complaints COVID 19 negative PPE: Used by the provider:   P100  eye Goggles,  Gloves     Beauregard Jarrells 10/29/2018, 11:07 PM    [facility]     after 2 AM please page floor coverage PA If 7AM-7PM, please contact the day team taking care of the patient using [email or web address]

## 2018-10-29 NOTE — ED Notes (Signed)
Bed: NW29 Expected date:  Expected time:  Means of arrival:  Comments: EMS-85yo fever

## 2018-10-29 NOTE — ED Notes (Signed)
US in room 

## 2018-10-29 NOTE — ED Provider Notes (Signed)
[facility]-EMERGENCY DEPT Provider Note   CSN: [id number] Arrival date & time: 10/29/18  0601    History   Chief Complaint Chief Complaint  Patient presents with   Fever   Dementia    HPI Rachael Hudson is a 85 y.o. female history of Alzheimer's, hypertension, here presenting with fever. Patient is from [facility] nursing home. She was noted to have fever 103 F at the facility.  No complaint per the facility.  Particular, patient denies any cough or shortness of breath or abdominal pain.  However history is limited by dementia.  No meds were given prior to arrival.     The history is provided by the EMS personnel.  Level V caveat- dementia   Past Medical History:  Diagnosis Date   Alzheimer disease (HCC)    Anemia    HOH (hard of hearing)    Hypertension    Restless leg syndrome     Patient Active Problem List   Diagnosis Date Noted   Elevated LFTs 10/29/2018   Cholecystitis without calculus 10/29/2018   Urinary tract infection without hematuria    Alzheimer's dementia (HCC) 01/19/2018   HTN (hypertension) 01/19/2018   Hip fracture (HCC) 01/18/2018    Past Surgical History:  Procedure Laterality Date   INTRAMEDULLARY (IM) NAIL INTERTROCHANTERIC Left 01/19/2018   Procedure: INTRAMEDULLARY (IM) NAIL INTERTROCHANTRIC;  Surgeon: Eldred Manges, MD;  Location: [facility];  Service: Orthopedics;  Laterality: Left;   JOINT REPLACEMENT     right hip fracture       OB History   No obstetric history on file.      Home Medications    Prior to Admission medications   Medication Sig Start Date End Date Taking? Authorizing Provider  acetaminophen (TYLENOL) 325 MG tablet Take 650 mg by mouth QID.   Yes [provider]  aspirin (ASPIRIN 81) 81 MG chewable tablet Chew 81 mg by mouth daily.   Yes [provider]  citalopram (CELEXA) 20 MG tablet Take 20 mg by mouth daily.   Yes [provider]  divalproex  (DEPAKOTE SPRINKLE) 125 MG capsule Take 125 mg by mouth 2 (two) times daily.    Yes [provider]  donepezil (ARICEPT) 10 MG tablet Take 10 mg by mouth at bedtime.   Yes [provider]  hydrochlorothiazide (MICROZIDE) 12.5 MG capsule Take 12.5 mg by mouth daily.   Yes [provider]  lisinopril (PRINIVIL,ZESTRIL) 20 MG tablet Take 20 mg by mouth daily. 12/24/17  Yes [provider]  LORazepam (ATIVAN) 1 MG tablet Take 1 mg by mouth daily as needed for anxiety or agitation. 04/28/18  Yes [provider]  multivitamin-lutein (OCUVITE-LUTEIN) CAPS capsule Take 1 capsule by mouth daily.   Yes [provider]  Nutritional Supplements (NUTRITIONAL SHAKE PLUS PO) Take 237 mLs by mouth 3 (three) times daily.   Yes [provider]  omeprazole (PRILOSEC) 20 MG capsule Take 20 mg by mouth daily. 05/12/18  Yes [provider]  risperiDONE (RISPERDAL) 0.25 MG tablet Take 0.25 mg by mouth at bedtime.   Yes [provider]  senna-docusate (SENOKOT-S) 8.6-50 MG tablet Take 1 tablet by mouth at bedtime.   Yes [provider]  aspirin EC 325 MG EC tablet Take 1 tablet (325 mg total) by mouth daily with breakfast. Patient not taking: Reported on 10/29/2018 01/21/18   Naida Sleight, PA-C  cephALEXin (KEFLEX) 500 MG capsule Take 1 capsule (500 mg total) by mouth  2 (two) times daily. Patient not taking: Reported on 10/29/2018 05/13/18   Gilda Crease, MD  HYDROcodone-acetaminophen (NORCO/VICODIN) 5-325 MG tablet Take 1-2 tablets by mouth every 4 (four) hours as needed for moderate pain (pain score 4-6). Patient not taking: Reported on 10/29/2018 01/20/18   Naida Sleight, PA-C    Family History Family History  Family history unknown: Yes    Social History Social History   Tobacco Use   Smoking status: Unknown If Ever Smoked   Smokeless tobacco: Never Used  Substance Use Topics   Alcohol use: Not Currently   Drug  use: Not Currently     Allergies   Fluoxetine and Sulfa antibiotics   Review of Systems Review of Systems  Constitutional: Positive for fever.  All other systems reviewed and are negative.    Physical Exam Updated Vital Signs BP (!) 173/92 (BP Location: Left Arm)    Pulse 66    Temp (!) 101.7 F (38.7 C) (Rectal)    Resp 19    SpO2 99%   Physical Exam Vitals signs and nursing note reviewed.  Constitutional:      Comments: Demented   HENT:     Head: Normocephalic.     Mouth/Throat:     Mouth: Mucous membranes are moist.  Eyes:     Extraocular Movements: Extraocular movements intact.     Pupils: Pupils are equal, round, and reactive to light.  Neck:     Musculoskeletal: Normal range of motion.  Cardiovascular:     Rate and Rhythm: Normal rate.     Pulses: Normal pulses.     Heart sounds: Normal heart sounds.  Pulmonary:     Effort: Pulmonary effort is normal.     Breath sounds: Normal breath sounds.  Abdominal:     General: Abdomen is flat.     Palpations: Abdomen is soft.  Genitourinary:    Comments: Rectal- no obvious sacral decub ulcer  Musculoskeletal: Normal range of motion.  Skin:    General: Skin is warm.     Capillary Refill: Capillary refill takes less than 2 seconds.  Neurological:     Mental Status: She is alert.     Comments: Demented, moving all extremities, nonfocal neuro exam   Psychiatric:        Mood and Affect: Mood normal.      ED Treatments / Results  Labs (all labs ordered are listed, but only abnormal results are displayed) Labs Reviewed  CBC WITH DIFFERENTIAL/PLATELET - Abnormal; Notable for the following components:      Result Value   Platelets 91 (*)    Lymphs Abs 0.3 (*)    All other components within normal limits  COMPREHENSIVE METABOLIC PANEL - Abnormal; Notable for the following components:   Glucose, Bld 108 (*)    Calcium 8.3 (*)    Total Protein 6.4 (*)    Albumin 3.1 (*)    AST 269 (*)    ALT 589 (*)     Alkaline Phosphatase 249 (*)    Total Bilirubin 1.6 (*)    All other components within normal limits  URINALYSIS, ROUTINE W REFLEX MICROSCOPIC - Abnormal; Notable for the following components:   Color, Urine AMBER (*)    APPearance CLOUDY (*)    Hgb urine dipstick MODERATE (*)    Protein, ur 30 (*)    Nitrite POSITIVE (*)    Leukocytes,Ua LARGE (*)    WBC, UA >50 (*)    Bacteria, UA MANY (*)  All other components within normal limits  VALPROIC ACID LEVEL - Abnormal; Notable for the following components:   Valproic Acid Lvl <10 (*)    All other components within normal limits  CULTURE, BLOOD (ROUTINE X 2)  CULTURE, BLOOD (ROUTINE X 2)  SARS CORONAVIRUS 2 (HOSPITAL ORDER, PERFORMED IN [facility])  URINE CULTURE  LACTIC ACID, PLASMA  LACTIC ACID, PLASMA  PROTIME-INR  ACETAMINOPHEN LEVEL  HEPATITIS PANEL, ACUTE  CK  TROPONIN I  AMMONIA  BRAIN NATRIURETIC PEPTIDE  LACTATE DEHYDROGENASE  PROCALCITONIN    EKG None  Radiology Ct Head Wo Contrast  Result Date: 10/29/2018 CLINICAL DATA:  Altered LOC EXAM: CT HEAD WITHOUT CONTRAST TECHNIQUE: Contiguous axial images were obtained from the base of the skull through the vertex without intravenous contrast. COMPARISON:  CT brain 05/12/2018, 11/27/2017, 02/04/2016 FINDINGS: Brain: No acute territorial infarction, hemorrhage or intracranial mass. Advanced atrophy. Stable ventricle size. Extensive hypodensity within the periventricular and deep white matter, unchanged and presumably due to small vessel ischemic changes. Vascular: No hyperdense vessels.  Carotid vascular calcification. Skull: Normal. Negative for fracture or focal lesion. Sinuses/Orbits: Mild mucosal thickening in the ethmoid sinuses. Other: None IMPRESSION: 1. No CT evidence for acute intracranial abnormality. 2. Atrophy and extensive small vessel ischemic changes of the white matter Electronically Signed   By: Jasmine Pang M.D.   On: 10/29/2018 22:22   Ct  Chest W Contrast  Result Date: 10/29/2018 CLINICAL DATA:  Fever EXAM: CT CHEST, ABDOMEN, AND PELVIS WITH CONTRAST TECHNIQUE: Multidetector CT imaging of the chest, abdomen and pelvis was performed following the standard protocol during bolus administration of intravenous contrast. CONTRAST:  OMNIPAQUE IOHEXOL 300 MG/ML  SOLN COMPARISON:  Chest x-ray today FINDINGS: CT CHEST FINDINGS Cardiovascular: Heart is borderline in size. Scattered coronary artery and aortic calcifications. No aneurysm. Mediastinum/Nodes: No mediastinal, hilar, or axillary adenopathy. Lungs/Pleura: Small bilateral effusions. Bilateral lower lobe atelectasis. Platelike atelectasis or scarring in the right middle lobe. Mild vascular congestion. Musculoskeletal: Chest wall soft tissues are unremarkable. No acute bony abnormality. CT ABDOMEN PELVIS FINDINGS Hepatobiliary: Gallbladder wall appears mildly thickened. No visible stones. No focal hepatic abnormality or biliary ductal dilatation. Pancreas: No focal abnormality or ductal dilatation. Spleen: No focal abnormality.  Normal size. Adrenals/Urinary Tract: Very large cyst off the lower pole of the left kidney measuring up to 13.3 cm. There is moderate left hydronephrosis, likely related to mass effect from the large renal cyst. No visible stones. Small cyst in the midpole of the right kidney. No hydronephrosis on the right. Adrenal glands and urinary bladder unremarkable. Stomach/Bowel: Moderate stool throughout the colon. Sigmoid diverticulosis. No active diverticulitis. Stomach and small bowel decompressed, unremarkable. No evidence of bowel obstruction. Vascular/Lymphatic: Aortic atherosclerosis. No enlarged abdominal or pelvic lymph nodes. Reproductive: Uterus and adnexa unremarkable.  No mass. Other: No free fluid or free air. Musculoskeletal: No acute bony abnormality. Degenerative changes in the lumbar spine. IMPRESSION: Small bilateral pleural effusions with compressive  atelectasis in the lower lobes. Cardiomegaly.  Mild pulmonary vascular congestion. Scattered moderate coronary artery calcifications. Indistinctness and apparent wall thickening of the gallbladder wall. No visible stones. This could reflect cholecystitis. Consider further evaluation with right upper quadrant ultrasound. Moderate left hydronephrosis due to large exophytic cyst off the lower pole of the left kidney causing mass effect at the pelvic brim. Moderate stool burden.  Sigmoid diverticulosis. Aortic atherosclerosis. Electronically Signed   By: Charlett Nose M.D.   On: 10/29/2018 22:24   Ct Abdomen Pelvis W  Contrast  Result Date: 10/29/2018 CLINICAL DATA:  Fever EXAM: CT CHEST, ABDOMEN, AND PELVIS WITH CONTRAST TECHNIQUE: Multidetector CT imaging of the chest, abdomen and pelvis was performed following the standard protocol during bolus administration of intravenous contrast. CONTRAST:  OMNIPAQUE IOHEXOL 300 MG/ML  SOLN COMPARISON:  Chest x-ray today FINDINGS: CT CHEST FINDINGS Cardiovascular: Heart is borderline in size. Scattered coronary artery and aortic calcifications. No aneurysm. Mediastinum/Nodes: No mediastinal, hilar, or axillary adenopathy. Lungs/Pleura: Small bilateral effusions. Bilateral lower lobe atelectasis. Platelike atelectasis or scarring in the right middle lobe. Mild vascular congestion. Musculoskeletal: Chest wall soft tissues are unremarkable. No acute bony abnormality. CT ABDOMEN PELVIS FINDINGS Hepatobiliary: Gallbladder wall appears mildly thickened. No visible stones. No focal hepatic abnormality or biliary ductal dilatation. Pancreas: No focal abnormality or ductal dilatation. Spleen: No focal abnormality.  Normal size. Adrenals/Urinary Tract: Very large cyst off the lower pole of the left kidney measuring up to 13.3 cm. There is moderate left hydronephrosis, likely related to mass effect from the large renal cyst. No visible stones. Small cyst in the midpole of the right  kidney. No hydronephrosis on the right. Adrenal glands and urinary bladder unremarkable. Stomach/Bowel: Moderate stool throughout the colon. Sigmoid diverticulosis. No active diverticulitis. Stomach and small bowel decompressed, unremarkable. No evidence of bowel obstruction. Vascular/Lymphatic: Aortic atherosclerosis. No enlarged abdominal or pelvic lymph nodes. Reproductive: Uterus and adnexa unremarkable.  No mass. Other: No free fluid or free air. Musculoskeletal: No acute bony abnormality. Degenerative changes in the lumbar spine. IMPRESSION: Small bilateral pleural effusions with compressive atelectasis in the lower lobes. Cardiomegaly.  Mild pulmonary vascular congestion. Scattered moderate coronary artery calcifications. Indistinctness and apparent wall thickening of the gallbladder wall. No visible stones. This could reflect cholecystitis. Consider further evaluation with right upper quadrant ultrasound. Moderate left hydronephrosis due to large exophytic cyst off the lower pole of the left kidney causing mass effect at the pelvic brim. Moderate stool burden.  Sigmoid diverticulosis. Aortic atherosclerosis. Electronically Signed   By: Charlett Nose M.D.   On: 10/29/2018 22:24   Dg Chest Port 1 View  Result Date: 10/29/2018 CLINICAL DATA:  Fever EXAM: PORTABLE CHEST 1 VIEW COMPARISON:  05/12/2018 FINDINGS: Cardiomegaly with vascular congestion. Elevation of the right hemidiaphragm. Bibasilar atelectasis with small effusions. No overt edema. No acute bony abnormality. IMPRESSION: Cardiomegaly with vascular congestion. Bibasilar atelectasis with small effusions. Electronically Signed   By: Charlett Nose M.D.   On: 10/29/2018 19:35    Procedures Procedures (including critical care time)  CRITICAL CARE Performed by: Richardean Canal   Total critical care time: 30 minutes  Critical care time was exclusive of separately billable procedures and treating other patients.  Critical care was necessary to  treat or prevent imminent or life-threatening deterioration.  Critical care was time spent personally by me on the following activities: development of treatment plan with patient and/or surrogate as well as nursing, discussions with consultants, evaluation of patient's response to treatment, examination of patient, obtaining history from patient or surrogate, ordering and performing treatments and interventions, ordering and review of laboratory studies, ordering and review of radiographic studies, pulse oximetry and re-evaluation of patient's condition.   Medications Ordered in ED Medications  cefTRIAXone (ROCEPHIN) 1 g in sodium chloride 0.9 % 100 mL IVPB (1 g Intravenous New Bag/Given 10/29/18 2222)  sodium chloride (PF) 0.9 % injection (has no administration in time range)  cefTRIAXone (ROCEPHIN) 1 g in sodium chloride 0.9 % 100 mL IVPB (has no administration in time range)  sodium chloride 0.9 % bolus 1,000 mL (0 mLs Intravenous Stopped 10/29/18 2123)  acetaminophen (TYLENOL) suppository 650 mg (650 mg Rectal Given 10/29/18 2002)  iohexol (OMNIPAQUE) 300 MG/ML solution 100 mL (100 mLs Intravenous Contrast Given 10/29/18 2202)     Initial Impression / Assessment and Plan / ED Course  I have reviewed the triage vital signs and the nursing notes.  Pertinent labs & imaging results that were available during my care of the patient were reviewed by me and considered in my medical decision making (see chart for details).       Merlene Laughteratricia Pheasant is a 85 y.o. female here with fever. Consider UTI vs pneumonia. She is from nursing home so will check for COVID. Will do sepsis workup with labs, UA, CXR.   9:30 pm UA + UTI. LFTs elevated. COVID negative. I am concerned that she may be septic from UTI. Given rocephin. CT ab/pel.   10:41 PM CT showed possible cholecystitis but there are no gallstones. Consider acalculous cholecystitis. She is DNR. Dr. Adela Glimpseoutova will admit. She request that I discuss with  surgery. I talked to Dr. Carolynne Edouardoth from surgery, who recommend HIDA scan in the morning, and if positive, then consult surgery to see patient. Otherwise, since patient is [age] and is DNR, IV abx and conservative management.    Final Clinical Impressions(s) / ED Diagnoses   Final diagnoses:  Sepsis with acute liver failure without hepatic coma or septic shock, due to unspecified organism [facility](HCC)  Acute cystitis without hematuria  RUQ pain    ED Discharge Orders    None       Charlynne PanderYao, Torra Pala Hsienta, MD 10/29/18 2243

## 2018-10-29 NOTE — ED Notes (Signed)
Provider notified of v/s orders notified

## 2018-10-29 NOTE — ED Notes (Signed)
Urine from soiled brief noted to be malodorous while providing peri care. Pt cleaned and given new brief/linens.

## 2018-10-29 NOTE — ED Triage Notes (Signed)
Per [facility]- DNR Yellow Copy Pt [place]. Fever started today. Fever 103.1 Staff denies any other symptoms.

## 2018-10-29 NOTE — ED Notes (Signed)
In ct/xray

## 2018-10-29 NOTE — ED Notes (Signed)
ED Provider at bedside. 

## 2018-10-29 NOTE — ED Notes (Signed)
Emergency contact  [phone]  Rachael Hudson

## 2018-10-30 ENCOUNTER — Observation Stay (HOSPITAL_COMMUNITY): Payer: Medicare Other

## 2018-10-30 DIAGNOSIS — G2581 Restless legs syndrome: Secondary | ICD-10-CM | POA: Diagnosis present

## 2018-10-30 DIAGNOSIS — A419 Sepsis, unspecified organism: Secondary | ICD-10-CM | POA: Diagnosis present

## 2018-10-30 DIAGNOSIS — Z79899 Other long term (current) drug therapy: Secondary | ICD-10-CM | POA: Diagnosis not present

## 2018-10-30 DIAGNOSIS — Z888 Allergy status to other drugs, medicaments and biological substances status: Secondary | ICD-10-CM | POA: Diagnosis not present

## 2018-10-30 DIAGNOSIS — H18419 Arcus senilis, unspecified eye: Secondary | ICD-10-CM | POA: Diagnosis present

## 2018-10-30 DIAGNOSIS — K8020 Calculus of gallbladder without cholecystitis without obstruction: Secondary | ICD-10-CM | POA: Diagnosis present

## 2018-10-30 DIAGNOSIS — D696 Thrombocytopenia, unspecified: Secondary | ICD-10-CM | POA: Diagnosis present

## 2018-10-30 DIAGNOSIS — Z7982 Long term (current) use of aspirin: Secondary | ICD-10-CM | POA: Diagnosis not present

## 2018-10-30 DIAGNOSIS — G9341 Metabolic encephalopathy: Secondary | ICD-10-CM | POA: Diagnosis present

## 2018-10-30 DIAGNOSIS — Z882 Allergy status to sulfonamides status: Secondary | ICD-10-CM | POA: Diagnosis not present

## 2018-10-30 DIAGNOSIS — N136 Pyonephrosis: Secondary | ICD-10-CM | POA: Diagnosis present

## 2018-10-30 DIAGNOSIS — H9190 Unspecified hearing loss, unspecified ear: Secondary | ICD-10-CM | POA: Diagnosis present

## 2018-10-30 DIAGNOSIS — B9620 Unspecified Escherichia coli [E. coli] as the cause of diseases classified elsewhere: Secondary | ICD-10-CM | POA: Diagnosis present

## 2018-10-30 DIAGNOSIS — E8809 Other disorders of plasma-protein metabolism, not elsewhere classified: Secondary | ICD-10-CM | POA: Diagnosis present

## 2018-10-30 DIAGNOSIS — Z66 Do not resuscitate: Secondary | ICD-10-CM | POA: Diagnosis present

## 2018-10-30 DIAGNOSIS — R54 Age-related physical debility: Secondary | ICD-10-CM | POA: Diagnosis present

## 2018-10-30 DIAGNOSIS — F0280 Dementia in other diseases classified elsewhere without behavioral disturbance: Secondary | ICD-10-CM | POA: Diagnosis present

## 2018-10-30 DIAGNOSIS — I119 Hypertensive heart disease without heart failure: Secondary | ICD-10-CM | POA: Diagnosis present

## 2018-10-30 DIAGNOSIS — K819 Cholecystitis, unspecified: Secondary | ICD-10-CM | POA: Diagnosis not present

## 2018-10-30 DIAGNOSIS — Z8781 Personal history of (healed) traumatic fracture: Secondary | ICD-10-CM | POA: Diagnosis not present

## 2018-10-30 DIAGNOSIS — E860 Dehydration: Secondary | ICD-10-CM | POA: Diagnosis present

## 2018-10-30 DIAGNOSIS — Z20828 Contact with and (suspected) exposure to other viral communicable diseases: Secondary | ICD-10-CM | POA: Diagnosis present

## 2018-10-30 DIAGNOSIS — G309 Alzheimer's disease, unspecified: Secondary | ICD-10-CM | POA: Diagnosis present

## 2018-10-30 DIAGNOSIS — R1011 Right upper quadrant pain: Secondary | ICD-10-CM | POA: Diagnosis present

## 2018-10-30 DIAGNOSIS — L89151 Pressure ulcer of sacral region, stage 1: Secondary | ICD-10-CM | POA: Diagnosis present

## 2018-10-30 DIAGNOSIS — K8309 Other cholangitis: Secondary | ICD-10-CM | POA: Diagnosis present

## 2018-10-30 DIAGNOSIS — Z87440 Personal history of urinary (tract) infections: Secondary | ICD-10-CM | POA: Diagnosis not present

## 2018-10-30 LAB — AMMONIA: Ammonia: 18 umol/L (ref 9–35)

## 2018-10-30 LAB — COMPREHENSIVE METABOLIC PANEL
ALT: 426 U/L — ABNORMAL HIGH (ref 0–44)
AST: 137 U/L — ABNORMAL HIGH (ref 15–41)
Albumin: 2.8 g/dL — ABNORMAL LOW (ref 3.5–5.0)
Alkaline Phosphatase: 191 U/L — ABNORMAL HIGH (ref 38–126)
Anion gap: 7 (ref 5–15)
BUN: 16 mg/dL (ref 8–23)
CO2: 27 mmol/L (ref 22–32)
Calcium: 8.2 mg/dL — ABNORMAL LOW (ref 8.9–10.3)
Chloride: 107 mmol/L (ref 98–111)
Creatinine, Ser: 0.54 mg/dL (ref 0.44–1.00)
GFR calc Af Amer: >60 mL/min (ref >60)
GFR calc non Af Amer: >60 mL/min (ref >60)
Glucose, Bld: 85 mg/dL (ref 70–99)
Potassium: 3.5 mmol/L (ref 3.5–5.1)
Sodium: 141 mmol/L (ref 135–145)
Total Bilirubin: 0.7 mg/dL (ref 0.3–1.2)
Total Protein: 5.7 g/dL — ABNORMAL LOW (ref 6.5–8.1)

## 2018-10-30 LAB — BLOOD GAS, VENOUS
Acid-Base Excess: 4 mmol/L — ABNORMAL HIGH (ref 0.0–2.0)
Bicarbonate: 27.5 mmol/L (ref 20.0–28.0)
O2 Saturation: 99.7 %
Patient temperature: 37
pCO2, Ven: 38.4 mmHg — ABNORMAL LOW (ref 44.0–60.0)
pH, Ven: 7.469 — ABNORMAL HIGH (ref 7.250–7.430)
pO2, Ven: 186 mmHg — ABNORMAL HIGH (ref 32.0–45.0)

## 2018-10-30 LAB — PROCALCITONIN: Procalcitonin: 0.37 ng/mL

## 2018-10-30 LAB — BLOOD CULTURE ID PANEL (REFLEXED)

## 2018-10-30 LAB — PHOSPHORUS: Phosphorus: 3.4 mg/dL (ref 2.5–4.6)

## 2018-10-30 LAB — CBC
HCT: 39.7 % (ref 36.0–46.0)
Hemoglobin: 12.6 g/dL (ref 12.0–15.0)
MCH: 31.5 pg (ref 26.0–34.0)
MCHC: 31.7 g/dL (ref 30.0–36.0)
MCV: 99.3 fL (ref 80.0–100.0)
Platelets: 118 10*3/uL — ABNORMAL LOW (ref 150–400)
RBC: 4 MIL/uL (ref 3.87–5.11)
RDW: 13.6 % (ref 11.5–15.5)
WBC: 3.3 10*3/uL — ABNORMAL LOW (ref 4.0–10.5)
nRBC: 0 % (ref 0.0–0.2)

## 2018-10-30 LAB — DIRECT ANTIGLOBULIN TEST (NOT AT ARMC)
DAT, IgG: NEGATIVE
DAT, complement: NEGATIVE

## 2018-10-30 LAB — TROPONIN I: Troponin I: 0.03 ng/mL (ref <0.03)

## 2018-10-30 LAB — TSH: TSH: 1.052 u[IU]/mL (ref 0.350–4.500)

## 2018-10-30 LAB — LACTATE DEHYDROGENASE: LDH: 235 U/L — ABNORMAL HIGH (ref 98–192)

## 2018-10-30 LAB — MAGNESIUM: Magnesium: 2.1 mg/dL (ref 1.7–2.4)

## 2018-10-30 LAB — CK: Total CK: 47 U/L (ref 38–234)

## 2018-10-30 LAB — PREALBUMIN: Prealbumin: 10.6 mg/dL — ABNORMAL LOW (ref 18–38)

## 2018-10-30 MED ORDER — BISACODYL 10 MG RE SUPP: 10.0000 mg | Freq: Every day | RECTAL | Status: DC | PRN

## 2018-10-30 MED ORDER — RISPERIDONE 0.25 MG PO TABS
0.2500 mg | ORAL_TABLET | Freq: Every day | ORAL | Status: DC
  Administered 2018-10-31: 0.25 mg via ORAL
  Filled 2018-10-30 (×2): qty 1

## 2018-10-30 MED ORDER — SODIUM CHLORIDE 0.9 % IV SOLN
2.0000 g | INTRAVENOUS | Status: DC
  Administered 2018-10-30 – 2018-11-03 (×5): 2 g via INTRAVENOUS
  Filled 2018-10-30 (×3): qty 2
  Filled 2018-10-30: qty 20
  Filled 2018-10-30: qty 2

## 2018-10-30 MED ORDER — SODIUM CHLORIDE 0.9 % IV SOLN
1.0000 g | INTRAVENOUS | Status: DC
  Filled 2018-10-30: qty 10

## 2018-10-30 MED ORDER — DIVALPROEX SODIUM 125 MG PO CSDR
125.0000 mg | DELAYED_RELEASE_CAPSULE | Freq: Two times a day (BID) | ORAL | Status: DC
  Filled 2018-10-30: qty 1

## 2018-10-30 MED ORDER — HYDRALAZINE HCL 20 MG/ML IJ SOLN
5.0000 mg | Freq: Once | INTRAMUSCULAR | Status: AC
  Administered 2018-10-30: 5 mg via INTRAVENOUS
  Filled 2018-10-30: qty 1

## 2018-10-30 MED ORDER — MORPHINE SULFATE (PF) 4 MG/ML IV SOLN
2.5000 mg | Freq: Once | INTRAVENOUS | Status: AC
  Administered 2018-10-30: 2.5 mg via INTRAVENOUS
  Filled 2018-10-30: qty 1

## 2018-10-30 MED ORDER — TECHNETIUM TC 99M MEBROFENIN IV KIT
5.2000 | PACK | Freq: Once | INTRAVENOUS | Status: AC | PRN
  Administered 2018-10-30: 5.2 via INTRAVENOUS

## 2018-10-30 MED ORDER — MORPHINE BOLUS VIA INFUSION: 2.5000 mg | Freq: Once | INTRAVENOUS | Status: DC

## 2018-10-30 MED ORDER — MORPHINE BOLUS VIA INFUSION
2.5000 mg | Freq: Once | INTRAVENOUS | Status: DC
  Filled 2018-10-30: qty 3

## 2018-10-30 MED ORDER — ENSURE ENLIVE PO LIQD
237.0000 mL | Freq: Two times a day (BID) | ORAL | Status: DC
  Administered 2018-10-30 – 2018-11-01 (×5): 237 mL via ORAL

## 2018-10-30 MED ORDER — ONDANSETRON HCL 4 MG PO TABS: 4.0000 mg | ORAL_TABLET | Freq: Four times a day (QID) | ORAL | Status: DC | PRN

## 2018-10-30 MED ORDER — FLEET ENEMA 7-19 GM/118ML RE ENEM: 1.0000 | ENEMA | Freq: Once | RECTAL | Status: DC | PRN

## 2018-10-30 MED ORDER — ONDANSETRON HCL 4 MG/2ML IJ SOLN: 4.0000 mg | Freq: Four times a day (QID) | INTRAMUSCULAR | Status: DC | PRN

## 2018-10-30 MED ORDER — SODIUM CHLORIDE 0.9 % IV SOLN
INTRAVENOUS | Status: DC
  Administered 2018-10-30 – 2018-11-01 (×3): via INTRAVENOUS

## 2018-10-30 MED ORDER — SODIUM CHLORIDE 0.9 % IV SOLN
2.0000 g | INTRAVENOUS | Status: DC
  Filled 2018-10-30: qty 20

## 2018-10-30 NOTE — Progress Notes (Addendum)
Initial Nutrition Assessment  RD working remotely.   DOCUMENTATION CODES:   (unable to assess for malnutrition at this time. )  INTERVENTION:  - will order Ensure Enlive BID, each supplement provides 350 kcal and 20 grams of protein. - continue to encourage PO intakes and aid with feeding, if needed.    NUTRITION DIAGNOSIS:   Inadequate oral intake related to inability to eat as evidenced by NPO status.  GOAL:   Patient will meet greater than or equal to 90% of their needs  MONITOR:   PO intake, Supplement acceptance, Labs, Weight trends  REASON FOR ASSESSMENT:   Consult Malnutrition Eval  ASSESSMENT:   85 y.o. female with medical history significant of Alzheimer's dementia, frequent falls s/p hip replacement, anemia, and essential HTN. She presented to the ED from facility with fever up to 103 degrees F, no cough or any other symptoms. Patient did have a strong smell of urine on arrival.  Diet was advanced from NPO to Soft ~10 minutes ago; no intakes possible since admission. RN flow sheet indicates that patient is disoriented x4. Per chart review, current weight is 131 lb and the next most recent weight was on 06/27/16 at [facility] when patient weighed 149 lb. This indicates 18 lb weight loss (12% body weight) in ~1.5 years but unsure if weight loss has occurred more acutely.   Per notes: patient with cholangitis vs pyelonephritis, severe dementia and frailty.  Will continue to monitor POG/GOC. Suspect patient will need assistance with meals and that oral intakes have been inadequate given advanced stage of Alzheimer's disease.     Medications reviewed. Labs reviewed; Ca: 8.2 mg/dl, Alk Phos elevated, LFTs elevated.  IVF; NS @ 50 ml/hr.     NUTRITION - FOCUSED PHYSICAL EXAM:  unable to complete at this time.   Diet Order:   Diet Order            DIET SOFT Room service appropriate? Yes; Fluid consistency: Thin  Diet effective now              EDUCATION NEEDS:    Not appropriate for education at this time  Skin:  Skin Assessment: Reviewed RN Assessment  Last BM:  5/7  Height:   Ht Readings from Last 1 Encounters:  No data found for Ht    Weight:   Wt Readings from Last 1 Encounters:  10/30/18 59.4 kg    Ideal Body Weight:     BMI:  There is no height or weight on file to calculate BMI.  Estimated Nutritional Needs:   Kcal:  1665-1780 kcal  Protein:  60-70 grams  Fluid:  >/= 1.8 L/day     Jarome Matin, MS, RD, LDN, [facility] Inpatient Clinical Dietitian Pager # [phone] After hours/weekend pager # [phone]

## 2018-10-30 NOTE — Progress Notes (Signed)
OT Cancellation Note  Patient Details Name: Charlisa Schor MRN: [id number] DOB: [birth year]   Cancelled Treatment:    Reason Eval/Treat Not Completed: Other (comment)  OY screened, no needs identified, will sign off Pt has hx of significant dementia and per chart is nonverbal.  RN reports pt does not follow commands.  Pt is a resident at [facility].  Pt does not appear appropriate for acute OT needs at this time.  Will defer needs, if any, to SNF.  Lise Auer, OT Acute Rehabilitation Services [phone] Office- [phone]       Sadaf Przybysz, Karin Golden D 10/30/2018, 3:54 PM

## 2018-10-30 NOTE — Progress Notes (Signed)
PT Cancellation Note / Screen  Patient Details Name: Rachael Hudson MRN: [id number] DOB: 11/11/1923   Cancelled Treatment:    Reason Eval/Treat Not Completed: PT screened, no needs identified, will sign off Pt has hx of significant dementia and per chart is nonverbal.  RN reports pt does not follow commands.  Pt is a resident at [facility]  Pt does not appear appropriate for acute PT needs at this time.  Will defer needs, if any, to SNF.   Rachael Hudson,KATHrine E 10/30/2018, 11:39 AM Zenovia Jarred, PT, DPT Acute Rehabilitation Services Office: [phone] Pager: [phone]

## 2018-10-30 NOTE — Progress Notes (Signed)
Pharmacy Antibiotic Note  Rachael Hudson is a 85 y.o. female admitted on 10/29/2018 with Intra-abdominal infection.  Pharmacy has been consulted for zosyn dosing.  Plan: Zosyn 3.375g IV q8h (4 hour infusion).  Weight: 130 lb 15.3 oz (59.4 kg)  Temp (24hrs), Avg:99.4 F (37.4 C), Min:97.8 F (36.6 C), Max:101.7 F (38.7 C)  Recent Labs  Lab 10/29/18 1912 10/29/18 1918 10/29/18 2140  WBC  --  5.0  --   CREATININE  --  0.59  --   LATICACIDVEN 1.1  --  0.7    CrCl cannot be calculated (Unknown ideal weight.).    Allergies  Allergen Reactions  . Fluoxetine Other (See Comments)    Unknown - unable to confirm via MAR  . Sulfa Antibiotics Other (See Comments)    Unknown - unable to confirm via MAR    Antimicrobials this admission: Zosyn 10/30/2018 >>   Dose adjustments this admission: -  Microbiology results: -  Thank you for allowing pharmacy to be a part of this patient's care.  Aleene Davidson Crowford 10/30/2018 4:44 AM

## 2018-10-30 NOTE — TOC Initial Note (Signed)
Transition of Care [facility](TOC) - Initial/Assessment Note    Patient Details  Name: Rachael Hudson MRN: [id number] Date of Birth: 05/16/1924  Transition of Care [facility](TOC) CM/SW Contact:    Lanier ClamMahabir, Irena Gaydos, RN Phone Number: 10/30/2018, 12:10 PM  Clinical Narrative:  [facility]. [facility] [place] plan to return back. Patient has a w/c. Will arrange PTAR @ d/c.                 Expected Discharge Plan: Assisted Living Barriers to Discharge: Continued Medical Work up   Patient Goals and CMS Choice Patient states their goals for this hospitalization and ongoing recovery are:: [facility] [employer] Medicare[employer] Compare Post Acute Care list provided to:: Patient Represenative (must comment) Choice offered to / list presented to : Adult Children  Expected Discharge Plan and Services Expected Discharge Plan: Assisted Living   Discharge Planning Services: CM Consult   Living arrangements for the past 2 months: Assisted Living Facility                                      Prior Living Arrangements/Services Living arrangements for the past 2 months: Assisted Living Facility Lives with:: Facility Resident Patient language and need for interpreter reviewed:: Yes Do you feel safe going back to the place where you live?: Yes      Need for Family Participation in Patient Care: No (Comment) Care giver support system in place?: Yes (comment) Current home services: DME(w/c) Criminal Activity/Legal Involvement Pertinent to Current Situation/Hospitalization: No - Comment as needed  Activities of Daily Living Home Assistive Devices/Equipment: Blood pressure cuff, Grab bars around toilet, Grab bars in shower, Hand-held shower hose, Hospital bed, Walker (specify type), Wheelchair, Scales([facility] has necessary equipment for their  residents) ADL Screening (condition at time of admission) Patient's cognitive ability adequate to safely complete daily activities?: No Is the patient deaf or have difficulty hearing?: No Does the patient have difficulty seeing, even when wearing glasses/contacts?: No Does the patient have difficulty concentrating, remembering, or making decisions?: Yes Patient able to express need for assistance with ADLs?: No Does the patient have difficulty dressing or bathing?: Yes Independently performs ADLs?: No Communication: Independent Dressing (OT): Needs assistance Is this a change from baseline?: Pre-admission baseline Grooming: Needs assistance Is this a change from baseline?: Pre-admission baseline Feeding: Needs assistance Is this a change from baseline?: Pre-admission baseline Bathing: Needs assistance Is this a change from baseline?: Pre-admission baseline Toileting: Needs assistance Is this a change from baseline?: Pre-admission baseline In/Out Bed: Needs assistance Is this a change from baseline?: Pre-admission baseline Walks in Home: Needs assistance Is this a change from baseline?: Pre-admission baseline Does the patient have difficulty walking or climbing stairs?: Yes Weakness of Legs: Both Weakness of Arms/Hands: Both  Permission Sought/Granted Permission sought to share information with : Case Manager Permission granted to share information with : Yes, Verbal Permission Granted  Share Information with NAME: Dorma Russelldwin Larke  Permission granted to share info w [facility]  Permission granted to share info w Relationship: son  Permission granted to share info w Contact Information: [phone]  Emotional Assessment Appearance:: Appears stated age Attitude/Demeanor/Rapport: Engaged Affect (typically observed): Accepting Orientation: : Oriented to Self Alcohol / Substance Use: Never Used Psych Involvement: No (comment)  Admission diagnosis:  RUQ  pain [R10.11] Acute cystitis without hematuria [N30.00] Sepsis with acute liver failure without hepatic coma or septic shock, due to unspecified organism (HCC) [A41.9, R65.20, K72.00] Patient Active Problem List   Diagnosis Date Noted  . Elevated LFTs 10/29/2018  . Cholecystitis without calculus 10/29/2018  . Cholecystitis 10/29/2018  . Acute metabolic encephalopathy 10/29/2018  . Thrombocytopenia (HCC) 10/29/2018  . Sepsis (HCC) 10/29/2018  . Cardiomegaly 10/29/2018  . Hypoalbuminemia 10/29/2018  . Urinary tract infection without hematuria   . Alzheimer's dementia (HCC) 01/19/2018  . HTN (hypertension) 01/19/2018  . Hip fracture (HCC) 01/18/2018   PCP:  Keane Police, MD Pharmacy:   [facility], [place] - [address] [place] [address] Phone: [phone] Fax: [phone]     Social Determinants of Health (SDOH) Interventions    Readmission Risk Interventions No flowsheet data found.

## 2018-10-30 NOTE — ED Notes (Signed)
Date and time results received: 10/30/18 "12:48 AM (use smartphrase ".now" to insert current time)  Test: Troponin Critical Value: 0.03 Name of Provider Notified: Adela Glimpse, MD  Orders Received? Or Actions Taken?:

## 2018-10-30 NOTE — NC FL2 (Signed)
[place] MEDICAID FL2 LEVEL OF CARE SCREENING TOOL     IDENTIFICATION  Patient Name: Zoiee Kalinowski Birthdate: 1924/03/28 Sex: female Admission Date (Current Location): 10/29/2018  [facility] and [place] Number:  Producer, television/film/video and Address:  [facility],  [address], [place] [address]      Provider Number:    Attending Physician Name and Address:  Rhetta Mura, MD  Relative Name and Phone Number:  Roshelle Decapua [phone]    Current Level of Care: Hospital Recommended Level of Care: Memory Care Prior Approval Number:    Date Approved/Denied:   PASRR Number:    Discharge Plan: Other (Comment)([facility])    Current Diagnoses: Patient Active Problem List   Diagnosis Date Noted  . Cholelithiases 10/30/2018  . Elevated LFTs 10/29/2018  . Cholecystitis without calculus 10/29/2018  . Cholecystitis 10/29/2018  . Acute metabolic encephalopathy 10/29/2018  . Thrombocytopenia (HCC) 10/29/2018  . Sepsis (HCC) 10/29/2018  . Cardiomegaly 10/29/2018  . Hypoalbuminemia 10/29/2018  . Urinary tract infection without hematuria   . Alzheimer's dementia (HCC) 01/19/2018  . HTN (hypertension) 01/19/2018  . Hip fracture (HCC) 01/18/2018    Orientation RESPIRATION BLADDER Height & Weight     Self  Normal Incontinent Weight: 59.4 kg Height:     BEHAVIORAL SYMPTOMS/MOOD NEUROLOGICAL BOWEL NUTRITION STATUS      Incontinent (S) Diet(Soft)  AMBULATORY STATUS COMMUNICATION OF NEEDS Skin   Limited Assist Verbally Normal                       Personal Care Assistance Level of Assistance  Bathing, Feeding, Dressing Bathing Assistance: Limited assistance Feeding assistance: Limited assistance Dressing Assistance: Limited assistance     Functional Limitations Info             SPECIAL CARE FACTORS FREQUENCY  PT (By licensed PT), OT (By licensed OT)     PT Frequency: 5x week OT Frequency: 5x week             Contractures Contractures Info: Not present    Additional Factors Info  Code Status, Allergies Code Status Info: DNR Allergies Info: Fluoxetine, Sulfa Antibiotics           Current Medications (10/30/2018):  This is the current hospital active medication list Current Facility-Administered Medications  Medication Dose Route Frequency Provider Last Rate Last Dose  . 0.9 %  sodium chloride infusion   Intravenous Continuous Rhetta Mura, MD 50 mL/hr at 10/30/18 1354    . bisacodyl (DULCOLAX) suppository 10 mg  10 mg Rectal Daily PRN Doutova, Anastassia, MD      . cefTRIAXone (ROCEPHIN) 2 g in sodium chloride 0.9 % 100 mL IVPB  2 g Intravenous Q24H Rhetta Mura, MD 200 mL/hr at 10/30/18 1431 2 g at 10/30/18 1431  . feeding supplement (ENSURE ENLIVE) (ENSURE ENLIVE) liquid 237 mL  237 mL Oral BID BM Rhetta Mura, MD   237 mL at 10/30/18 1417  . ondansetron (ZOFRAN) tablet 4 mg  4 mg Oral Q6H PRN Therisa Doyne, MD       Or  . ondansetron (ZOFRAN) injection 4 mg  4 mg Intravenous Q6H PRN Doutova, Anastassia, MD      . risperiDONE (RISPERDAL) tablet 0.25 mg  0.25 mg Oral QHS Doutova, Anastassia, MD      . sodium phosphate (FLEET) 7-19 GM/118ML enema 1 enema  1 enema Rectal Once PRN Therisa Doyne, MD  Discharge Medications: Please see discharge summary for a list of discharge medications.  Relevant Imaging Results:  Relevant Lab Results:   Additional Information [id number]  Aarna Mihalko, Olegario MessierKathy, RN

## 2018-10-30 NOTE — ED Notes (Signed)
ED TO INPATIENT HANDOFF REPORT  ED Nurse Name and Phone #: [facility]   S Name/Age/Gender Rachael Hudson 85 y.o. female Room/Bed: WA25/WA25  Code Status   Code Status: DNR  Home/SNF/Other Nursing Home {Patient oriented questionable  Is this baseline?  Unable to determine   Triage Complete: Triage complete  Chief Complaint fever  Triage Note Per [facility]- DNR Yellow Copy Pt [place]. Fever started today. Fever 103.1 Staff denies any other symptoms.    Allergies Allergies  Allergen Reactions  . Fluoxetine Other (See Comments)    Unknown - unable to confirm via MAR  . Sulfa Antibiotics Other (See Comments)    Unknown - unable to confirm via MAR    Level of Care/Admitting Diagnosis ED Disposition    ED Disposition Condition Comment   Admit  Hospital Area: [facility] [[id number]]  Level of Care: Telemetry [5]  Admit to tele based on following criteria: Other see comments  Comments: chf?  Covid Evaluation: N/A  Diagnosis: Cholecystitis [161096]  Admitting Physician: Therisa Doyne [[id number]]  Attending Physician: Therisa Doyne [[id number]]  PT Class (Do Not Modify): Observation [104]  PT Acc Code (Do Not Modify): Observation [10022]       B Medical/Surgery History Past Medical History:  Diagnosis Date  . Alzheimer disease (HCC)   . Anemia   . HOH (hard of hearing)   . Hypertension   . Restless leg syndrome    Past Surgical History:  Procedure Laterality Date  . INTRAMEDULLARY (IM) NAIL INTERTROCHANTERIC Left 01/19/2018   Procedure: INTRAMEDULLARY (IM) NAIL INTERTROCHANTRIC;  Surgeon: Eldred Manges, MD;  Location: [facility];  Service: Orthopedics;  Laterality: Left;  . JOINT REPLACEMENT    . right hip fracture       A IV Location/Drains/Wounds Patient Lines/Drains/Airways Status   Active Line/Drains/Airways    Name:   Placement date:   Placement time:   Site:   Days:   Peripheral IV 10/29/18 Right Wrist   10/29/18    1901    Wrist    1   Peripheral IV 10/29/18 Right Forearm   10/29/18    1915    Forearm   1   Peripheral IV 10/30/18 Left Arm   10/30/18    0027    Arm   less than 1   Incision (Closed) 01/19/18 Hip Left   01/19/18    2105     284          Intake/Output Last 24 hours  Intake/Output Summary (Last 24 hours) at 10/30/2018 0104 Last data filed at 10/29/2018 2007 Gross per 24 hour  Intake -  Output 50 ml  Net -50 ml    Labs/Imaging Results for orders placed or performed during the hospital encounter of 10/29/18 (from the past 48 hour(s))  Lactic acid, plasma     Status: None   Collection Time: 10/29/18  7:12 PM  Result Value Ref Range   Lactic Acid, Venous 1.1 0.5 - 1.9 mmol/L    Comment: Performed at [facility], [address], [place], [place] [address]  Valproic acid level     Status: Abnormal   Collection Time: 10/29/18  7:15 PM  Result Value Ref Range   Valproic Acid Lvl <10 (L) 50.0 - 100.0 ug/mL    Comment: Performed at [facility], [address] [place], [place] [address]  Brain natriuretic peptide     Status: Abnormal   Collection Time: 10/29/18  7:17 PM  Result Value Ref Range   B Natriuretic Peptide 129.5 (H) 0.0 - 100.0 pg/mL    Comment: Performed at [facility], [address] [place], [place] [address]  CBC with Differential/Platelet     Status: Abnormal   Collection Time: 10/29/18  7:18 PM  Result Value Ref Range   WBC 5.0 4.0 - 10.5 K/uL   RBC 4.20 3.87 - 5.11 MIL/uL   Hemoglobin 13.4 12.0 - 15.0 g/dL   HCT 60.440.5 54.036.0 - 98.146.0 %   MCV 96.4 80.0 - 100.0 fL   MCH 31.9 26.0 - 34.0 pg   MCHC 33.1 30.0 - 36.0 g/dL   RDW 19.113.8 47.811.5 - 29.515.5 %   Platelets 91 (L) 150 - 400 K/uL    Comment: REPEATED TO VERIFY PLATELET COUNT CONFIRMED BY SMEAR SPECIMEN CHECKED FOR CLOTS Immature Platelet Fraction may be clinically indicated, consider ordering this additional test [id number]    nRBC 0.0 0.0 - 0.2 %   Neutrophils Relative % 86  %   Neutro Abs 4.3 1.7 - 7.7 K/uL   Lymphocytes Relative 7 %   Lymphs Abs 0.3 (L) 0.7 - 4.0 K/uL   Monocytes Relative 6 %   Monocytes Absolute 0.3 0.1 - 1.0 K/uL   Eosinophils Relative 0 %   Eosinophils Absolute 0.0 0.0 - 0.5 K/uL   Basophils Relative 1 %   Basophils Absolute 0.0 0.0 - 0.1 K/uL   RBC Morphology MORPHOLOGY UNREMARKABLE    Immature Granulocytes 0 %   Abs Immature Granulocytes 0.02 0.00 - 0.07 K/uL    Comment: Performed at [facility], [address] [place], [place] [address]  Comprehensive metabolic panel     Status: Abnormal   Collection Time: 10/29/18  7:18 PM  Result Value Ref Range   Sodium 137 135 - 145 mmol/L   Potassium 4.3 3.5 - 5.1 mmol/L   Chloride 102 98 - 111 mmol/L   CO2 27 22 - 32 mmol/L   Glucose, Bld 108 (H) 70 - 99 mg/dL   BUN 19 8 - 23 mg/dL   Creatinine, Ser 6.290.59 0.44 - 1.00 mg/dL   Calcium 8.3 (L) 8.9 - 10.3 mg/dL   Total Protein 6.4 (L) 6.5 - 8.1 g/dL   Albumin 3.1 (L) 3.5 - 5.0 g/dL   AST 528269 (H) 15 - 41 U/L   ALT 589 (H) 0 - 44 U/L   Alkaline Phosphatase 249 (H) 38 - 126 U/L   Total Bilirubin 1.6 (H) 0.3 - 1.2 mg/dL   GFR calc non Af Amer >60 >60 mL/min   GFR calc Af Amer >60 >60 mL/min   Anion gap 8 5 - 15    Comment: Performed at [facility], [address] [place], [place] [address]  Blood culture (routine x 2)     Status: None (Preliminary result)   Collection Time: 10/29/18  7:18 PM  Result Value Ref Range   Specimen Description      BLOOD RIGHT ARM Performed at [facility], [address] [place], [place] [address]    Special Requests      BOTTLES DRAWN AEROBIC AND ANAEROBIC Blood Culture adequate volume Performed at [facility], [address] [place], [place] [address]    Culture PENDING    Report Status PENDING   SARS Coronavirus 2 (CEPHEID - Performed in [facility] hospital lab), Hosp Order     Status: None   Collection Time: 10/29/18  7:18 PM  Result Value Ref Range   SARS Coronavirus 2 NEGATIVE NEGATIVE    Comment: (NOTE) If result is NEGATIVE SARS-CoV-2 target nucleic acids are NOT DETECTED. The SARS-CoV-2 RNA is generally detectable in upper and lower  respiratory specimens during the acute phase of infection. The lowest  concentration of SARS-CoV-2 viral copies this assay can detect is 250  copies / mL. A negative result does not preclude SARS-CoV-2 infection  and should not be used as the sole basis for treatment or other  patient management decisions.  A negative result may occur with  improper specimen collection / handling, submission of specimen other  than nasopharyngeal swab, presence of viral mutation(s) within the  areas targeted by this assay, and inadequate number of viral copies  (<250 copies / mL). A negative result must be combined with clinical  observations, patient history, and epidemiological information. If result is POSITIVE SARS-CoV-2 target nucleic acids are DETECTED. The SARS-CoV-2 RNA is generally detectable in upper and lower  respiratory specimens dur ing the acute phase of infection.  Positive  results are indicative of active infection with SARS-CoV-2.  Clinical  correlation with patient history and other diagnostic information is  necessary to determine patient infection status.  Positive results do  not rule out bacterial infection or co-infection with other viruses. If result is PRESUMPTIVE POSTIVE SARS-CoV-2 nucleic acids MAY BE PRESENT.   A presumptive positive result was obtained on the submitted specimen  and confirmed on repeat testing.  While 2019 novel coronavirus  (SARS-CoV-2) nucleic acids may be present in the submitted sample  additional confirmatory testing may be necessary for epidemiological  and / or clinical management purposes  to differentiate between  SARS-CoV-2 and other Sarbecovirus currently known to infect humans.  If clinically indicated additional testing with  an alternate test  methodology [id number]) is advised. The SARS-CoV-2 RNA is generally  detectable in upper and lower respiratory sp ecimens during the acute  phase of infection. The expected result is Negative. Fact Sheet for Patients:  BoilerBrush.com.cy Fact Sheet for Healthcare Providers: https://pope.com/ This test is not yet approved or cleared by the [place] FDA and has been authorized for detection and/or diagnosis of SARS-CoV-2 by FDA under an Emergency Use Authorization (EUA).  This EUA will remain in effect (meaning this test can be used) for the duration of the COVID-19 declaration under Section 564(b)(1) of the Act, 21 U.S.C. section 360bbb-3(b)(1), unless the authorization is terminated or revoked sooner. Performed at [facility], [address] [place], [place] [address]   Urinalysis, Routine w reflex microscopic     Status: Abnormal   Collection Time: 10/29/18  8:10 PM  Result Value Ref Range   Color, Urine AMBER (A) YELLOW    Comment: BIOCHEMICALS MAY BE AFFECTED BY COLOR   APPearance CLOUDY (A) CLEAR   Specific Gravity, Urine 1.017 1.005 - 1.030   pH 6.0 5.0 - 8.0   Glucose, UA NEGATIVE NEGATIVE mg/dL   Hgb urine dipstick MODERATE (A) NEGATIVE   Bilirubin Urine NEGATIVE NEGATIVE   Ketones, ur NEGATIVE NEGATIVE mg/dL   Protein, ur 30 (A) NEGATIVE mg/dL   Nitrite POSITIVE (A) NEGATIVE   Leukocytes,Ua LARGE (A) NEGATIVE   RBC / HPF 21-50 0 - 5 RBC/hpf   WBC, UA >50 (H) 0 - 5 WBC/hpf   Bacteria, UA MANY (A) NONE SEEN   Squamous Epithelial / LPF 0-5 0 - 5   Mucus PRESENT     Comment: Performed at [facility], [address] [place], [place] [address]  Lactic acid, plasma     Status: None   Collection Time: 10/29/18  9:40 PM  Result Value Ref Range   Lactic Acid, Venous 0.7 0.5 - 1.9 mmol/L    Comment: Performed at [facility], [address],  [place], [place] [address]  Blood culture (routine x 2)     Status: None (Preliminary result)   Collection Time: 10/29/18  9:40 PM  Result Value Ref Range   Specimen Description      BLOOD LEFT FOREARM Performed at [facility], [address] [place], [place] [address]    Special Requests      BOTTLES DRAWN AEROBIC AND ANAEROBIC Blood Culture adequate volume Performed at [facility], [address], [place], [place] [address]    Culture PENDING    Report Status PENDING   Protime-INR     Status: None   Collection Time: 10/29/18  9:40 PM  Result Value Ref Range   Prothrombin Time 14.8 11.4 - 15.2 seconds   INR 1.2 0.8 - 1.2    Comment: (NOTE) INR goal varies based on device and disease states. Performed at [facility], [address] [place], [place] [address]   Acetaminophen level     Status: None   Collection Time: 10/29/18  9:40 PM  Result Value Ref Range   Acetaminophen (Tylenol), Serum 14 10 - 30 ug/mL    Comment: (NOTE) Therapeutic concentrations vary significantly. A range of 10-30 ug/mL  may be an effective concentration for many patients. However, some  are best treated at concentrations outside of this range. Acetaminophen concentrations >150 ug/mL at 4 hours after ingestion  and >50 ug/mL at 12 hours after ingestion are often associated with  toxic reactions. Performed at [facility], [address], [place], [place] [address]   CK     Status: None   Collection Time: 10/29/18  9:45 PM  Result Value Ref Range   Total CK 47 38 - 234 U/L    Comment: Performed at [facility], [address], [place], [place] [address]  Procalcitonin     Status: None   Collection Time: 10/29/18  9:45 PM  Result Value Ref Range   Procalcitonin 0.37 ng/mL    Comment:        Interpretation: PCT (Procalcitonin) <= 0.5 ng/mL: Systemic infection (sepsis) is not likely. Local bacterial infection is  possible. (NOTE)       Sepsis PCT Algorithm           Lower Respiratory Tract                                      Infection PCT Algorithm    ----------------------------     ----------------------------         PCT < 0.25 ng/mL                PCT < 0.10 ng/mL         Strongly encourage             Strongly discourage   discontinuation of antibiotics    initiation of antibiotics    ----------------------------     -----------------------------       PCT 0.25 - 0.50 ng/mL            PCT 0.10 -  0.25 ng/mL               OR       >80% decrease in PCT            Discourage initiation of                                            antibiotics      Encourage discontinuation           of antibiotics    ----------------------------     -----------------------------         PCT >= 0.50 ng/mL              PCT 0.26 - 0.50 ng/mL               AND        <80% decrease in PCT             Encourage initiation of                                             antibiotics       Encourage continuation           of antibiotics    ----------------------------     -----------------------------        PCT >= 0.50 ng/mL                  PCT > 0.50 ng/mL               AND         increase in PCT                  Strongly encourage                                      initiation of antibiotics    Strongly encourage escalation           of antibiotics                                     -----------------------------                                           PCT <= 0.25 ng/mL                                                 OR                                        > 80% decrease in PCT  Discontinue / Do not initiate                                             antibiotics Performed at [facility], [address] [place], [place] [address]   Troponin I - Add-On to previous collection     Status: Abnormal   Collection Time: 10/29/18 10:30 PM  Result Value  Ref Range   Troponin I 0.03 (HH) <0.03 ng/mL    Comment: CRITICAL RESULT CALLED TO, READ BACK BY AND VERIFIED WITH: RN COLES L AT 1914 10/30/18 CRUICKSHANK A Performed at [facility], [address] [place], [place] [address]   Lactate dehydrogenase     Status: Abnormal   Collection Time: 10/29/18 10:30 PM  Result Value Ref Range   LDH 235 (H) 98 - 192 U/L    Comment: Performed at [facility], [address] [place], [place] [address]  Direct antiglobulin test (not at [facility])     Status: None   Collection Time: 10/29/18 10:56 PM  Result Value Ref Range   DAT, complement NEG    DAT, IgG      NEG Performed at [facility], [address], [place], [place] [address]   Blood gas, venous     Status: Abnormal   Collection Time: 10/29/18 11:55 PM  Result Value Ref Range   pH, Ven 7.469 (H) 7.250 - 7.430   pCO2, Ven 38.4 (L) 44.0 - 60.0 mmHg   pO2, Ven 186.0 (H) 32.0 - 45.0 mmHg   Bicarbonate 27.5 20.0 - 28.0 mmol/L   Acid-Base Excess 4.0 (H) 0.0 - 2.0 mmol/L   O2 Saturation 99.7 %   Patient temperature 37.0    Collection site VEIN    Drawn by DRAWN BY RN    Sample type VENOUS     Comment: Performed at [facility], [address], [place], [place] [address]   Ct Head Wo Contrast  Result Date: 10/29/2018 CLINICAL DATA:  Altered LOC EXAM: CT HEAD WITHOUT CONTRAST TECHNIQUE: Contiguous axial images were obtained from the base of the skull through the vertex without intravenous contrast. COMPARISON:  CT brain 05/12/2018, 11/27/2017, 02/04/2016 FINDINGS: Brain: No acute territorial infarction, hemorrhage or intracranial mass. Advanced atrophy. Stable ventricle size. Extensive hypodensity within the periventricular and deep white matter, unchanged and presumably due to small vessel ischemic changes. Vascular: No hyperdense vessels.  Carotid vascular calcification. Skull: Normal. Negative for fracture or focal lesion.  Sinuses/Orbits: Mild mucosal thickening in the ethmoid sinuses. Other: None IMPRESSION: 1. No CT evidence for acute intracranial abnormality. 2. Atrophy and extensive small vessel ischemic changes of the white matter Electronically Signed   By: Jasmine Pang M.D.   On: 10/29/2018 22:22   Ct Chest W Contrast  Result Date: 10/29/2018 CLINICAL DATA:  Fever EXAM: CT CHEST, ABDOMEN, AND PELVIS WITH CONTRAST TECHNIQUE: Multidetector CT imaging of the chest, abdomen and pelvis was performed following the standard protocol during bolus administration of intravenous contrast. CONTRAST:  OMNIPAQUE IOHEXOL 300 MG/ML  SOLN COMPARISON:  Chest x-ray today FINDINGS: CT CHEST FINDINGS Cardiovascular: Heart is borderline in size. Scattered coronary artery and aortic calcifications. No aneurysm. Mediastinum/Nodes: No mediastinal, hilar, or axillary adenopathy. Lungs/Pleura: Small bilateral effusions. Bilateral lower lobe atelectasis. Platelike atelectasis or scarring in the right middle lobe. Mild vascular congestion. Musculoskeletal: Chest  wall soft tissues are unremarkable. No acute bony abnormality. CT ABDOMEN PELVIS FINDINGS Hepatobiliary: Gallbladder wall appears mildly thickened. No visible stones. No focal hepatic abnormality or biliary ductal dilatation. Pancreas: No focal abnormality or ductal dilatation. Spleen: No focal abnormality.  Normal size. Adrenals/Urinary Tract: Very large cyst off the lower pole of the left kidney measuring up to 13.3 cm. There is moderate left hydronephrosis, likely related to mass effect from the large renal cyst. No visible stones. Small cyst in the midpole of the right kidney. No hydronephrosis on the right. Adrenal glands and urinary bladder unremarkable. Stomach/Bowel: Moderate stool throughout the colon. Sigmoid diverticulosis. No active diverticulitis. Stomach and small bowel decompressed, unremarkable. No evidence of bowel obstruction. Vascular/Lymphatic: Aortic atherosclerosis.  No enlarged abdominal or pelvic lymph nodes. Reproductive: Uterus and adnexa unremarkable.  No mass. Other: No free fluid or free air. Musculoskeletal: No acute bony abnormality. Degenerative changes in the lumbar spine. IMPRESSION: Small bilateral pleural effusions with compressive atelectasis in the lower lobes. Cardiomegaly.  Mild pulmonary vascular congestion. Scattered moderate coronary artery calcifications. Indistinctness and apparent wall thickening of the gallbladder wall. No visible stones. This could reflect cholecystitis. Consider further evaluation with right upper quadrant ultrasound. Moderate left hydronephrosis due to large exophytic cyst off the lower pole of the left kidney causing mass effect at the pelvic brim. Moderate stool burden.  Sigmoid diverticulosis. Aortic atherosclerosis. Electronically Signed   By: Charlett Nose M.D.   On: 10/29/2018 22:24   Ct Abdomen Pelvis W Contrast  Result Date: 10/29/2018 CLINICAL DATA:  Fever EXAM: CT CHEST, ABDOMEN, AND PELVIS WITH CONTRAST TECHNIQUE: Multidetector CT imaging of the chest, abdomen and pelvis was performed following the standard protocol during bolus administration of intravenous contrast. CONTRAST:  OMNIPAQUE IOHEXOL 300 MG/ML  SOLN COMPARISON:  Chest x-ray today FINDINGS: CT CHEST FINDINGS Cardiovascular: Heart is borderline in size. Scattered coronary artery and aortic calcifications. No aneurysm. Mediastinum/Nodes: No mediastinal, hilar, or axillary adenopathy. Lungs/Pleura: Small bilateral effusions. Bilateral lower lobe atelectasis. Platelike atelectasis or scarring in the right middle lobe. Mild vascular congestion. Musculoskeletal: Chest wall soft tissues are unremarkable. No acute bony abnormality. CT ABDOMEN PELVIS FINDINGS Hepatobiliary: Gallbladder wall appears mildly thickened. No visible stones. No focal hepatic abnormality or biliary ductal dilatation. Pancreas: No focal abnormality or ductal dilatation. Spleen: No focal  abnormality.  Normal size. Adrenals/Urinary Tract: Very large cyst off the lower pole of the left kidney measuring up to 13.3 cm. There is moderate left hydronephrosis, likely related to mass effect from the large renal cyst. No visible stones. Small cyst in the midpole of the right kidney. No hydronephrosis on the right. Adrenal glands and urinary bladder unremarkable. Stomach/Bowel: Moderate stool throughout the colon. Sigmoid diverticulosis. No active diverticulitis. Stomach and small bowel decompressed, unremarkable. No evidence of bowel obstruction. Vascular/Lymphatic: Aortic atherosclerosis. No enlarged abdominal or pelvic lymph nodes. Reproductive: Uterus and adnexa unremarkable.  No mass. Other: No free fluid or free air. Musculoskeletal: No acute bony abnormality. Degenerative changes in the lumbar spine. IMPRESSION: Small bilateral pleural effusions with compressive atelectasis in the lower lobes. Cardiomegaly.  Mild pulmonary vascular congestion. Scattered moderate coronary artery calcifications. Indistinctness and apparent wall thickening of the gallbladder wall. No visible stones. This could reflect cholecystitis. Consider further evaluation with right upper quadrant ultrasound. Moderate left hydronephrosis due to large exophytic cyst off the lower pole of the left kidney causing mass effect at the pelvic brim. Moderate stool burden.  Sigmoid diverticulosis. Aortic atherosclerosis. Electronically Signed   By: Caryn Bee  [place]over M.D.   On: 10/29/2018 22:24   Dg Chest Port 1 View  Result Date: 10/29/2018 CLINICAL DATA:  Fever EXAM: PORTABLE CHEST 1 VIEW COMPARISON:  05/12/2018 FINDINGS: Cardiomegaly with vascular congestion. Elevation of the right hemidiaphragm. Bibasilar atelectasis with small effusions. No overt edema. No acute bony abnormality. IMPRESSION: Cardiomegaly with vascular congestion. Bibasilar atelectasis with small effusions. Electronically Signed   By: Charlett Nose M.D.   On: 10/29/2018 19:35    US Abdomen Limited Ruq  Result Date: 10/29/2018 CLINICAL DATA:  Right upper quadrant pain, fever EXAM: ULTRASOUND ABDOMEN LIMITED RIGHT UPPER QUADRANT COMPARISON:  CT 10/29/2018 FINDINGS: Gallbladder: Gallbladder wall is thickened at 6 mm. Stones and sludge within the gallbladder. The largest stone 7 mm. Common bile duct: Diameter: Upper limits normal in diameter, 7 mm. Liver: No focal lesion identified. Within normal limits in parenchymal echogenicity. Portal vein is patent on color Doppler imaging with normal direction of blood flow towards the liver. IMPRESSION: Stones and sludge within the gallbladder. There is gallbladder wall thickening. Findings concerning for cholecystitis. Electronically Signed   By: Charlett Nose M.D.   On: 10/29/2018 23:41    Pending Labs Unresulted Labs (From admission, onward)    Start     Ordered   10/29/18 2256  Haptoglobin  Add-on,   R     10/29/18 2255   10/29/18 2148  Ammonia  Once,   STAT     10/29/18 2147   10/29/18 2013  Hepatitis panel, acute  ONCE - STAT,   STAT     10/29/18 2012   10/29/18 1847  Urine culture  ONCE - STAT,   STAT     10/29/18 1846   Signed and Held  Magnesium  Tomorrow morning,   R    Comments:  Call MD if <1.5    Signed and Held   Signed and Held  Phosphorus  Tomorrow morning,   R     Signed and Held   Signed and Held  TSH  Once,   R    Comments:  Cancel if already done within 1 month and notify MD    Signed and Held   Signed and Held  Comprehensive metabolic panel  Once,   R    Comments:  Cal MD for K<3.5 or >5.0    Signed and Held   Signed and Held  CBC  Once,   R    Comments:  Call for hg <8.0    Signed and Held   Signed and Held  Prealbumin  Tomorrow morning,   R     Signed and Held          Vitals/Pain Today's Vitals   10/29/18 2330 10/30/18 0000 10/30/18 0031 10/30/18 0101  BP: (!) 196/64 (!) 165/73 (!) 199/70 (!) 174/81  Pulse: 64  69 63  Resp: 18 18 19 19   Temp:   99 F (37.2 C) 99.1 F (37.3 C)   TempSrc:   Oral Oral  SpO2: 99%  100% 98%  PainSc:   Asleep 0-No pain    Isolation Precautions No active isolations  Medications Medications  sodium chloride (PF) 0.9 % injection (has no administration in time range)  0.9 %  sodium chloride infusion ( Intravenous New Bag/Given 10/30/18 0030)  piperacillin-tazobactam (ZOSYN) IVPB 3.375 g (3.375 g Intravenous New Bag/Given 10/30/18 0030)  sodium chloride 0.9 % bolus 1,000 mL (0 mLs Intravenous Stopped 10/29/18 2123)  acetaminophen (TYLENOL) suppository 650 mg (650 mg Rectal Given 10/29/18 2002)  cefTRIAXone (ROCEPHIN) 1 g in sodium chloride 0.9 % 100 mL IVPB (0 g Intravenous Stopped 10/29/18 2308)  iohexol (OMNIPAQUE) 300 MG/ML solution 100 mL (100 mLs Intravenous Contrast Given 10/29/18 2202)    Mobility non-ambulatory High fall risk   Focused Assessments    R Recommendations: See Admitting Provider Note  Report given to:   Additional Notes:

## 2018-10-30 NOTE — Care Management Obs Status (Signed)
MEDICARE OBSERVATION STATUS NOTIFICATION   Patient Details  Name: Rachael Hudson MRN: [id number] Date of Birth: 1924-06-02   Medicare Observation Status Notification Given:  Yes    [name], RN 10/30/2018, 12:02 PM

## 2018-10-30 NOTE — Progress Notes (Addendum)
[facility] PROGRESS NOTE  Rachael Hudson [id number] DOB: 05-01-1924 DOA: 10/29/2018 PCP: Alvester Morin, MD  Narrative: 85 year old [name] Severe dementia, HTN, left-sided intertrochanteric fracture 2019, frequent UTIs She was discharged 12/2017 after hip fracture and was recommended comfort care She is nonverbal at baseline  Found at facility fever 103-in the ED LFTs elevated platelets 91 COVID negative  CT abdomen pelvis?  Cholecystitis-General surgery curb sided  A & Plan Cholangitis vs pyelonephritis HIDA scan cholecystitis Continue empiric coverage with ceftriaxone at this time Monitor trends of LFTs which are already coming down Minimize medications with the propensity to cause transaminitis Dementia-stage VII Patient on Depakote and will discontinue because of transaminitis as above Can use Risperdal 0.25 and augment if needed Holding citalopram at this time may need to use Ativan if uncontrolled HTN Hold HCTZ, hold lisinopril-monitor Prior hip fracture 12/2017 Monitor would not use pain meds Severe dementia and frailty No mortality benefit aspirin, multivitamin, supplements-recommend discussion at facility regarding the same  DVT CD code Status: DNR communication:  Discussed with son on phone--he tells me she lost her hearing aids at the facility--Usually wears glasses Was at [facility] 02/2018 broken hip--she went to rehab--she couldn't walk again--baseline sits all day at facility  Evidently she was coherent enough o recognize people around her May 1st at facility.  disposition Plan: Inpatient   Khylon Davies, MD  [facility] [email or web address] 7PM-7AM contact night coverage as above 10/30/2018, 1:21 PM  LOS: 0 days   Consultants:  None  Procedures:  No  Antimicrobials:  Zosyn  Interval history/Subjective: Unable to get ROS gripping my hands  Objective:  Vitals:  Vitals:   10/30/18 0519 10/30/18 1304   BP: (!) 177/70 (!) 174/79  Pulse: 63 74  Resp: 20 18  Temp: 97.9 F (36.6 C) 98.7 F (37.1 C)  SpO2: 94% 94%    Exam:  Arcus senilis nonverbal slight twisting of mouth EOMI NCAT Chest clear without added sound Stage I decubitus ulcer natal cleft Abdomen soft however some discomfort in epigastrium and middle part of abdomen she tenses and braces her abdomen when I try to examine Moving all 4 limbs but contractures on the left knee seems to favor the right   I have personally reviewed the following:  DATA   Labs:  Alk phos down from 2 49-1 91  AST down from 2 69-1 37  ALT down from 5 89-4 26  Prealbumin 10.6 down fro  White count down from 5.0-3.3 platelets up from 91-1 18   Imaging studies:  HIDA scan  IMPRESSION: 1. Patent cystic duct and common bile duct. 2. Filling of the gallbladder is inconsistent with acute cholecystitis.   Scheduled Meds: . feeding supplement (ENSURE ENLIVE)  237 mL Oral BID BM  . risperiDONE  0.25 mg Oral QHS   Continuous Infusions: . sodium chloride 50 mL/hr at 10/30/18 1354  . cefTRIAXone (ROCEPHIN)  IV 2 g (10/30/18 1431)    Principal Problem:   Cholecystitis without calculus Active Problems:   Alzheimer's dementia ([place])   HTN (hypertension)   Urinary tract infection without hematuria   Elevated LFTs   Cholecystitis   Acute metabolic encephalopathy   Thrombocytopenia (HCC)   Sepsis (HCC)   Cardiomegaly   Hypoalbuminemia   Cholelithiases   LOS: 0 days

## 2018-10-30 NOTE — Progress Notes (Signed)
PHARMACY - PHYSICIAN COMMUNICATION CRITICAL VALUE ALERT - BLOOD CULTURE IDENTIFICATION (BCID)  Rachael Hudson is an 85 y.o. female who presented to [facility] on 10/29/2018 with a chief complaint of fever.  Assessment:  E. Coli per BCID,  Initially question of cholecystitis but findings of HIDA not consistent with this  Name of physician (or Provider) Contacted: Dr Mahala Menghini  Current antibiotics: ceftriaxone  Changes to prescribed antibiotics recommended:  Patient is on recommended antibiotics - No changes needed.   Results for orders placed or performed during the hospital encounter of 10/29/18  Blood Culture ID Panel (Reflexed) (Collected: 10/29/2018  7:18 PM)  Result Value Ref Range   Enterococcus species NOT DETECTED NOT DETECTED   Listeria monocytogenes NOT DETECTED NOT DETECTED   Staphylococcus species NOT DETECTED NOT DETECTED   Staphylococcus aureus (BCID) NOT DETECTED NOT DETECTED   Streptococcus species NOT DETECTED NOT DETECTED   Streptococcus agalactiae NOT DETECTED NOT DETECTED   Streptococcus pneumoniae NOT DETECTED NOT DETECTED   Streptococcus pyogenes NOT DETECTED NOT DETECTED   Acinetobacter baumannii NOT DETECTED NOT DETECTED   Enterobacteriaceae species DETECTED (A) NOT DETECTED   Enterobacter cloacae complex NOT DETECTED NOT DETECTED   Escherichia coli DETECTED (A) NOT DETECTED   Klebsiella oxytoca NOT DETECTED NOT DETECTED   Klebsiella pneumoniae NOT DETECTED NOT DETECTED   Proteus species NOT DETECTED NOT DETECTED   Serratia marcescens NOT DETECTED NOT DETECTED   Carbapenem resistance NOT DETECTED NOT DETECTED   Haemophilus influenzae NOT DETECTED NOT DETECTED   Neisseria meningitidis NOT DETECTED NOT DETECTED   Pseudomonas aeruginosa NOT DETECTED NOT DETECTED   Candida albicans NOT DETECTED NOT DETECTED   Candida glabrata NOT DETECTED NOT DETECTED   Candida krusei NOT DETECTED NOT DETECTED   Candida parapsilosis NOT DETECTED NOT DETECTED   Candida  tropicalis NOT DETECTED NOT DETECTED    Juliette Alcide, PharmD, BCPS.   Work Cell: [phone] 10/30/2018 1:45 PM

## 2018-10-31 LAB — CBC WITH DIFFERENTIAL/PLATELET
Abs Immature Granulocytes: 0 10*3/uL (ref 0.00–0.07)
Basophils Absolute: 0 10*3/uL (ref 0.0–0.1)
Basophils Relative: 1 %
Eosinophils Absolute: 0 10*3/uL (ref 0.0–0.5)
Eosinophils Relative: 0 %
HCT: 38.1 % (ref 36.0–46.0)
Hemoglobin: 12.3 g/dL (ref 12.0–15.0)
Immature Granulocytes: 0 %
Lymphocytes Relative: 19 %
Lymphs Abs: 0.6 10*3/uL — ABNORMAL LOW (ref 0.7–4.0)
MCH: 32 pg (ref 26.0–34.0)
MCHC: 32.3 g/dL (ref 30.0–36.0)
MCV: 99.2 fL (ref 80.0–100.0)
Monocytes Absolute: 0.4 10*3/uL (ref 0.1–1.0)
Monocytes Relative: 13 %
Neutro Abs: 2.1 10*3/uL (ref 1.7–7.7)
Neutrophils Relative %: 67 %
Platelets: 123 10*3/uL — ABNORMAL LOW (ref 150–400)
RBC: 3.84 MIL/uL — ABNORMAL LOW (ref 3.87–5.11)
RDW: 13.3 % (ref 11.5–15.5)
WBC: 3.1 10*3/uL — ABNORMAL LOW (ref 4.0–10.5)
nRBC: 0 % (ref 0.0–0.2)

## 2018-10-31 LAB — COMPREHENSIVE METABOLIC PANEL
ALT: 281 U/L — ABNORMAL HIGH (ref 0–44)
AST: 63 U/L — ABNORMAL HIGH (ref 15–41)
Albumin: 2.8 g/dL — ABNORMAL LOW (ref 3.5–5.0)
Alkaline Phosphatase: 200 U/L — ABNORMAL HIGH (ref 38–126)
Anion gap: 10 (ref 5–15)
BUN: 21 mg/dL (ref 8–23)
CO2: 24 mmol/L (ref 22–32)
Calcium: 8.2 mg/dL — ABNORMAL LOW (ref 8.9–10.3)
Chloride: 109 mmol/L (ref 98–111)
Creatinine, Ser: 0.57 mg/dL (ref 0.44–1.00)
GFR calc Af Amer: >60 mL/min (ref >60)
GFR calc non Af Amer: >60 mL/min (ref >60)
Glucose, Bld: 77 mg/dL (ref 70–99)
Potassium: 3.2 mmol/L — ABNORMAL LOW (ref 3.5–5.1)
Sodium: 143 mmol/L (ref 135–145)
Total Bilirubin: 0.7 mg/dL (ref 0.3–1.2)
Total Protein: 5.7 g/dL — ABNORMAL LOW (ref 6.5–8.1)

## 2018-10-31 LAB — URINE CULTURE: Culture: >=100000 — AB

## 2018-10-31 LAB — HEPATITIS PANEL, ACUTE
HCV Ab: <0.1 s/co ratio (ref 0.0–0.9)
Hep A IgM: NEGATIVE
Hep B C IgM: NEGATIVE
Hepatitis B Surface Ag: NEGATIVE

## 2018-10-31 LAB — AMMONIA: Ammonia: 15 umol/L (ref 9–35)

## 2018-10-31 LAB — PROTIME-INR
INR: 1.1 (ref 0.8–1.2)
Prothrombin Time: 13.8 seconds (ref 11.4–15.2)

## 2018-10-31 LAB — HAPTOGLOBIN: Haptoglobin: 169 mg/dL (ref 41–333)

## 2018-10-31 MED ORDER — POTASSIUM CHLORIDE 20 MEQ PO PACK: 40.0000 meq | PACK | Freq: Every day | ORAL | Status: DC

## 2018-10-31 MED ORDER — POTASSIUM CHLORIDE CRYS ER 20 MEQ PO TBCR
40.0000 meq | EXTENDED_RELEASE_TABLET | Freq: Every day | ORAL | Status: DC
  Administered 2018-10-31 – 2018-11-04 (×5): 40 meq via ORAL
  Filled 2018-10-31 (×6): qty 2

## 2018-10-31 NOTE — Progress Notes (Addendum)
[facility] PROGRESS NOTE  Mahlani Berninger [id number] DOB: 07-03-1923 DOA: 10/29/2018 PCP: Alvester Morin, MD  Narrative: 85 year old Rachael Hudson Severe dementia, HTN, left-sided intertrochanteric fracture 2019, frequent UTIs She was discharged 12/2017 after hip fracture and was recommended comfort care She is nonverbal at baseline  Found at facility fever 103-in the ED LFTs elevated platelets 91 COVID negative  CT abdomen pelvis?  Cholecystitis-General surgery curb sided  A & Plan Cholangitis vs pyelonephritis Gram neg rods in urine/blood HIDA scan didn't confirm cholecystitis Continue empiric coverage with ceftriaxone at this time--needs 10-14 days LFTs ? and patient continues to improve Cont saline 50cc/h Dementia-stage VII  Depakote discontinue 2/2 transaminitis as above Can use Risperdal 0.25  Much more awake coherent and able to ask for coffee this am-nurse tech to attempt to feed her Started dys 3 RN to confirm NH PTA diet HTN Hold HCTZ, hold lisinopril-monitor Repeat labs a.m. would probably not use ACE or diuretic for blood pressure control going forward in a 85 year old Prior hip fracture 12/2017 Monitor would not use pain meds Severe dementia and frailty No mortality benefit aspirin, multivitamin, supplements-recommend discussion at facility regarding the same  DVT CD code Status: DNR communication:  Discussed with son on phone--he tells me she lost her hearing aids at the facility--Usually wears glasses Was at [facility] 02/2018 broken hip--she went to rehab--she couldn't walk again--baseline sits all day at facility Updated son again on 5/8 and he is appreciative of the care on 4 e  disposition Plan: Inpatient--- likely can discharge once completes course of therapy for Cholangitis   Verlon Au, MD  [facility] Via [employer] app [place] [email or web address] 7PM-7AM contact night coverage as above 10/31/2018, 1:29 PM  LOS: 1 day    Consultants:  None  Procedures:  No  Antimicrobials:  Zosyn  Interval history/Subjective:  Grips hands in no distress no fever no chills Drank mainly ensure per NT Willing to trial Macaroni  Objective:  Vitals:  Vitals:   10/31/18 1204 10/31/18 1324  BP: (!) 168/90 118/70  Pulse: 77   Resp:    Temp: 97.8 F (36.6 C)   SpO2:  93%    Exam:  Arcus senilis much morecoherent and alert today Poor LR bilaterally Chest clear without added sound Stage I decubitus ulcer natal cleft Abdomen soft  Moving all 4 limbs but contractures on the left knee seems to favor the right   I have personally reviewed the following:  DATA   Labs:  Alk phos down from 249-1 91--->200  AST down from 2 69-1 37-->63  ALT down from 5 89-4 26--->281  Prealbumin 10.6 down fro  White count down from 5.0-3.3 platelets up from 91--->123   Imaging studies:  HIDA scan  IMPRESSION: 1. Patent cystic duct and common bile duct. 2. Filling of the gallbladder is inconsistent with acute cholecystitis.   Scheduled Meds: . feeding supplement (ENSURE ENLIVE)  237 mL Oral BID BM  . potassium chloride  40 mEq Oral Daily  . risperiDONE  0.25 mg Oral QHS   Continuous Infusions: . sodium chloride 50 mL/hr at 10/31/18 0551  . cefTRIAXone (ROCEPHIN)  IV 2 g (10/30/18 1431)    Principal Problem:   Cholecystitis without calculus Active Problems:   Alzheimer's dementia ([place])   HTN (hypertension)   Urinary tract infection without hematuria   Elevated LFTs   Cholecystitis   Acute metabolic encephalopathy   Thrombocytopenia (HCC)   Sepsis (HCC)   Cardiomegaly   Hypoalbuminemia   Cholelithiases  LOS: 1 day

## 2018-11-01 LAB — COMPREHENSIVE METABOLIC PANEL
ALT: 195 U/L — ABNORMAL HIGH (ref 0–44)
AST: 35 U/L (ref 15–41)
Albumin: 2.7 g/dL — ABNORMAL LOW (ref 3.5–5.0)
Alkaline Phosphatase: 168 U/L — ABNORMAL HIGH (ref 38–126)
Anion gap: 10 (ref 5–15)
BUN: 20 mg/dL (ref 8–23)
CO2: 26 mmol/L (ref 22–32)
Calcium: 8.2 mg/dL — ABNORMAL LOW (ref 8.9–10.3)
Chloride: 107 mmol/L (ref 98–111)
Creatinine, Ser: 0.47 mg/dL (ref 0.44–1.00)
GFR calc Af Amer: >60 mL/min (ref >60)
GFR calc non Af Amer: >60 mL/min (ref >60)
Glucose, Bld: 87 mg/dL (ref 70–99)
Potassium: 3.3 mmol/L — ABNORMAL LOW (ref 3.5–5.1)
Sodium: 143 mmol/L (ref 135–145)
Total Bilirubin: 0.6 mg/dL (ref 0.3–1.2)
Total Protein: 5.5 g/dL — ABNORMAL LOW (ref 6.5–8.1)

## 2018-11-01 LAB — CULTURE, BLOOD (ROUTINE X 2)
Special Requests: ADEQUATE
Special Requests: ADEQUATE

## 2018-11-01 LAB — CBC WITH DIFFERENTIAL/PLATELET
Abs Immature Granulocytes: 0.01 10*3/uL (ref 0.00–0.07)
Basophils Absolute: 0 10*3/uL (ref 0.0–0.1)
Basophils Relative: 1 %
Eosinophils Absolute: 0 10*3/uL (ref 0.0–0.5)
Eosinophils Relative: 0 %
HCT: 38.2 % (ref 36.0–46.0)
Hemoglobin: 11.9 g/dL — ABNORMAL LOW (ref 12.0–15.0)
Immature Granulocytes: 0 %
Lymphocytes Relative: 33 %
Lymphs Abs: 1 10*3/uL (ref 0.7–4.0)
MCH: 30.7 pg (ref 26.0–34.0)
MCHC: 31.2 g/dL (ref 30.0–36.0)
MCV: 98.7 fL (ref 80.0–100.0)
Monocytes Absolute: 0.4 10*3/uL (ref 0.1–1.0)
Monocytes Relative: 12 %
Neutro Abs: 1.6 10*3/uL — ABNORMAL LOW (ref 1.7–7.7)
Neutrophils Relative %: 54 %
Platelets: 124 10*3/uL — ABNORMAL LOW (ref 150–400)
RBC: 3.87 MIL/uL (ref 3.87–5.11)
RDW: 13.2 % (ref 11.5–15.5)
WBC: 3.1 10*3/uL — ABNORMAL LOW (ref 4.0–10.5)
nRBC: 0 % (ref 0.0–0.2)

## 2018-11-01 LAB — MRSA PCR SCREENING: MRSA by PCR: NEGATIVE

## 2018-11-01 MED ORDER — ACETAMINOPHEN 500 MG PO TABS: 500.0000 mg | ORAL_TABLET | Freq: Four times a day (QID) | ORAL | Status: DC | PRN

## 2018-11-01 MED ORDER — NUTRITIONAL SHAKE PLUS PO LIQD: Freq: Three times a day (TID) | ORAL | Status: DC

## 2018-11-01 MED ORDER — LORAZEPAM 2 MG/ML IJ SOLN: 1.0000 mg | Freq: Once | INTRAMUSCULAR | Status: DC

## 2018-11-01 MED ORDER — HALOPERIDOL LACTATE 5 MG/ML IJ SOLN
1.0000 mg | Freq: Four times a day (QID) | INTRAMUSCULAR | Status: DC
  Administered 2018-11-01 – 2018-11-02 (×3): 1 mg via INTRAVENOUS
  Filled 2018-11-01 (×3): qty 1

## 2018-11-01 MED ORDER — DONEPEZIL HCL 10 MG PO TABS
10.0000 mg | ORAL_TABLET | Freq: Every day | ORAL | Status: DC
  Administered 2018-11-01 – 2018-11-03 (×3): 10 mg via ORAL
  Filled 2018-11-01 (×3): qty 1

## 2018-11-01 MED ORDER — ENSURE ENLIVE PO LIQD
237.0000 mL | Freq: Three times a day (TID) | ORAL | Status: DC
  Administered 2018-11-01 – 2018-11-04 (×8): 237 mL via ORAL

## 2018-11-01 MED ORDER — ACETAMINOPHEN 325 MG PO TABS: 650.0000 mg | ORAL_TABLET | Freq: Four times a day (QID) | ORAL | Status: DC | PRN

## 2018-11-01 MED ORDER — RISPERIDONE 0.25 MG PO TABS
0.2500 mg | ORAL_TABLET | Freq: Two times a day (BID) | ORAL | Status: DC
  Administered 2018-11-01 – 2018-11-03 (×5): 0.25 mg via ORAL
  Filled 2018-11-01 (×6): qty 1

## 2018-11-01 MED ORDER — HYDRALAZINE HCL 20 MG/ML IJ SOLN
5.0000 mg | Freq: Four times a day (QID) | INTRAMUSCULAR | Status: DC | PRN
  Administered 2018-11-01 – 2018-11-03 (×3): 5 mg via INTRAVENOUS
  Filled 2018-11-01 (×4): qty 1

## 2018-11-01 NOTE — Progress Notes (Signed)
[facility] PROGRESS NOTE  Rachael Hudson [id number] DOB: 08-28-1923 DOA: 10/29/2018 PCP: Alvester Morin, MD  Narrative: 85 year old Rachael Hudson ALF resident Severe dementia, HTN, left-sided intertrochanteric fracture 2019, frequent UTIs She was discharged 12/2017 after hip fracture and was recommended comfort care She is nonverbal at baseline  Found at facility fever 103-in the ED LFTs elevated platelets 91 COVID negative  CT abdomen pelvis?  Cholecystitis-General surgery curb sided  A & Plan Cholangitis vs pyelonephritis Gram neg rods in urine/blood HIDA scan didn't confirm cholecystitis ceftriaxone at this time--needs 10-14 days LFTs ? and continue to improve she is slightly agitated today however Dementia-stage VII Depakote discontinue 2/2 transaminitis as above Can use Risperdal 0.25  sundowning worse today so given a dose of Ativan x1 and should not receive further I have increased Risperdal from 0.25 at bedtime to twice daily I will place a Posey belt if she becomes severely agitated and she may need either Zydis or a sitter if this continues Delirium protocol initated HTN Hold HCTZ, hold lisinopril-monitor Repeat labs a.m. would probably not use ACE or diuretic for blood pressure control going forward in a 85 year old Prior hip fracture 12/2017 schedule low tylenol Severe dementia and frailty No mortality benefit aspirin, multivitamin, supplements-recommend discussion at facility regarding the same And   DVT CD code Status: DNR communication: no discussion with family today  disposition Plan: Inpatient--- likely can discharge once completes course of therapy for Cholangitis   Shaletha Humble, MD  [facility] Via [employer] app [place] [email or web address] 7PM-7AM contact night coverage as above 11/01/2018, 4:53 PM  LOS: 2 days   Consultants:  None  Procedures:  No  Antimicrobials:  Zosyn-->:ceftriaxone  Interval history/Subjective:  Well-agitated  however  Cannot easily redirect Pulled out catheter  Objective:  Vitals:  Vitals:   11/01/18 1100 11/01/18 1407  BP:  (!) 150/78  Pulse: 78 77  Resp: 20 20  Temp: 98.7 F (37.1 C)   SpO2: 98% 94%    Exam:  Arcus senilis Poor LR bilaterally Chest clear without added sound Stage I decubitus ulcer natal cleft Abdomen soft  Moving all 4 limbs but contractures on the left knee seems to favor the right   I have personally reviewed the following:  DATA   Labs:  Alk phos down from 249-191--->200--->168  AST down from 2 69-1 37-->63-->35  ALT down from 5 89-4 26--->281-->195  White count down from 5.0-3.3 platelets up from 91--->124   Imaging studies:  HIDA scan  IMPRESSION: 1. Patent cystic duct and common bile duct. 2. Filling of the gallbladder is inconsistent with acute cholecystitis.   Scheduled Meds: . feeding supplement (ENSURE ENLIVE)  237 mL Oral BID BM  . LORazepam  1 mg Intravenous Once  . potassium chloride  40 mEq Oral Daily  . risperiDONE  0.25 mg Oral BID   Continuous Infusions: . cefTRIAXone (ROCEPHIN)  IV 2 g (11/01/18 1551)    Principal Problem:   Cholecystitis without calculus Active Problems:   Alzheimer's dementia ([place])   HTN (hypertension)   Urinary tract infection without hematuria   Elevated LFTs   Cholecystitis   Acute metabolic encephalopathy   Thrombocytopenia (HCC)   Sepsis (HCC)   Cardiomegaly   Hypoalbuminemia   Cholelithiases   LOS: 2 days

## 2018-11-01 NOTE — Progress Notes (Signed)
Patient's B/P 204/98 (manual);HR 77.  PCP was notified

## 2018-11-01 NOTE — Progress Notes (Signed)
Pt voided 400 mL this shift with the bladder scanner showing 18 mL. No BM this shift, last BM reported on 10/31/2018.

## 2018-11-01 NOTE — Progress Notes (Signed)
Assumed pt care at 1530. Pt showing slight agitation but no acute distress or pain, vital signs are at baseline, and has unlabored breathing on room air. I agree with the previous RN's assessment and will continue to monitor the pt.

## 2018-11-02 LAB — CBC WITH DIFFERENTIAL/PLATELET
Abs Immature Granulocytes: 0.01 10*3/uL (ref 0.00–0.07)
Basophils Absolute: 0 10*3/uL (ref 0.0–0.1)
Basophils Relative: 1 %
Eosinophils Absolute: 0.1 10*3/uL (ref 0.0–0.5)
Eosinophils Relative: 2 %
HCT: 37.9 % (ref 36.0–46.0)
Hemoglobin: 12 g/dL (ref 12.0–15.0)
Immature Granulocytes: 0 %
Lymphocytes Relative: 27 %
Lymphs Abs: 1 10*3/uL (ref 0.7–4.0)
MCH: 30.9 pg (ref 26.0–34.0)
MCHC: 31.7 g/dL (ref 30.0–36.0)
MCV: 97.7 fL (ref 80.0–100.0)
Monocytes Absolute: 0.4 10*3/uL (ref 0.1–1.0)
Monocytes Relative: 12 %
Neutro Abs: 2.1 10*3/uL (ref 1.7–7.7)
Neutrophils Relative %: 58 %
Platelets: 143 10*3/uL — ABNORMAL LOW (ref 150–400)
RBC: 3.88 MIL/uL (ref 3.87–5.11)
RDW: 13.5 % (ref 11.5–15.5)
WBC: 3.6 10*3/uL — ABNORMAL LOW (ref 4.0–10.5)
nRBC: 0 % (ref 0.0–0.2)

## 2018-11-02 LAB — COMPREHENSIVE METABOLIC PANEL
ALT: 140 U/L — ABNORMAL HIGH (ref 0–44)
AST: 26 U/L (ref 15–41)
Albumin: 2.8 g/dL — ABNORMAL LOW (ref 3.5–5.0)
Alkaline Phosphatase: 148 U/L — ABNORMAL HIGH (ref 38–126)
Anion gap: 8 (ref 5–15)
BUN: 14 mg/dL (ref 8–23)
CO2: 25 mmol/L (ref 22–32)
Calcium: 8.2 mg/dL — ABNORMAL LOW (ref 8.9–10.3)
Chloride: 108 mmol/L (ref 98–111)
Creatinine, Ser: 0.43 mg/dL — ABNORMAL LOW (ref 0.44–1.00)
GFR calc Af Amer: >60 mL/min (ref >60)
GFR calc non Af Amer: >60 mL/min (ref >60)
Glucose, Bld: 95 mg/dL (ref 70–99)
Potassium: 3.5 mmol/L (ref 3.5–5.1)
Sodium: 141 mmol/L (ref 135–145)
Total Bilirubin: 0.5 mg/dL (ref 0.3–1.2)
Total Protein: 5.5 g/dL — ABNORMAL LOW (ref 6.5–8.1)

## 2018-11-02 MED ORDER — AMLODIPINE BESYLATE 10 MG PO TABS
10.0000 mg | ORAL_TABLET | Freq: Every day | ORAL | Status: DC
  Administered 2018-11-02 – 2018-11-04 (×3): 10 mg via ORAL
  Filled 2018-11-02 (×4): qty 1

## 2018-11-02 MED ORDER — HALOPERIDOL LACTATE 5 MG/ML IJ SOLN: 1.0000 mg | Freq: Every evening | INTRAMUSCULAR | Status: DC | PRN

## 2018-11-02 NOTE — Progress Notes (Signed)
[facility] PROGRESS NOTE  Rachael Hudson [id number] DOB: 12-Feb-1924 DOA: 10/29/2018 PCP: Alvester Morin, MD  Narrative: 85 year old [name] Severe dementia, HTN, left-sided intertrochanteric fracture 2019, frequent UTIs She was discharged 12/2017 after hip fracture and was recommended comfort care She is nonverbal at baseline  Found at facility fever 103-in the ED LFTs elevated platelets 91 COVID negative  CT abdomen pelvis?  Cholecystitis-General surgery curb sided  A & Plan Cholangitis vs pyelonephritis Gram neg rods in urine/blood HIDA scan didn't confirm cholecystitis ceftriaxone at this time--narrowing in am to orals to complete empiric 14 day therapy as unclear which source was most reliable LFTs ? and continue to improve--will stop checking given good trend to normal labs Dementia-stage VII Depakote discontinue 2/2 transaminitis as above I have increased Risperdal from 0.25 at bedtime to twice daily Placed on Haldol last pm, but now not very interactive so changing to qhs only Son is well aware of her cognitive issues HTN Hold HCTZ, hold lisinopril-monitor No ACE or diuretic for blood pressure control going forward  Hydralazine prn ordered add Prior hip fracture 12/2017 schedule low tylenol Severe dementia and frailty No mortality benefit aspirin, multivitamin, supplements-recommend discussion at facility regarding the same And   DVT CD code Status: DNR communication: no discussion with family today  disposition Plan: Inpatient--- likely can discharge 48 hours once IV abx narrowed   Verlon Au, MD  [facility] Via [employer] app [place] [email or web address] 7PM-7AM contact night coverage as above 11/02/2018, 1:36 PM  LOS: 3 days   Consultants:  None  Procedures:  No  Antimicrobials:  Zosyn-->:ceftriaxone  Interval history/Subjective:  Sleepy but rousable to some degree BP elevated otherwise not as interactive as  prior  Objective:  Vitals:  Vitals:   11/02/18 1049 11/02/18 1150  BP: (!) 175/78 (!) 157/83  Pulse: 81 92  Resp: (!) 27   Temp: 98.6 F (37 C)   SpO2: 93%     Exam:  Arcus senilis Sleepy cta b no added sound s1 s 2no m abd soft nt nd no rebound Skin tear RLE   I have personally reviewed the following:  DATA   Labs:  Alk phos down from 249-191--->200--->168-->148  AST down from 2 69-1 37-->63-->35-->26  ALT down from 5 89-4 26--->281-->195-->140  White count down from 5.0-3.3 platelets up from 91--->124-->143   Imaging studies:  HIDA scan  IMPRESSION: 1. Patent cystic duct and common bile duct. 2. Filling of the gallbladder is inconsistent with acute cholecystitis.   Scheduled Meds: . donepezil  10 mg Oral QHS  . feeding supplement (ENSURE ENLIVE)  237 mL Oral TID BM  . haloperidol lactate  1 mg Intravenous QHS,MR X 1  . LORazepam  1 mg Intravenous Once  . potassium chloride  40 mEq Oral Daily  . risperiDONE  0.25 mg Oral BID   Continuous Infusions: . cefTRIAXone (ROCEPHIN)  IV 2 g (11/01/18 1551)    Principal Problem:   Cholecystitis without calculus Active Problems:   Alzheimer's dementia ([place])   HTN (hypertension)   Urinary tract infection without hematuria   Elevated LFTs   Cholecystitis   Acute metabolic encephalopathy   Thrombocytopenia (HCC)   Sepsis (HCC)   Cardiomegaly   Hypoalbuminemia   Cholelithiases   LOS: 3 days

## 2018-11-03 LAB — COMPREHENSIVE METABOLIC PANEL
ALT: 108 U/L — ABNORMAL HIGH (ref 0–44)
AST: 25 U/L (ref 15–41)
Albumin: 2.8 g/dL — ABNORMAL LOW (ref 3.5–5.0)
Alkaline Phosphatase: 135 U/L — ABNORMAL HIGH (ref 38–126)
Anion gap: 7 (ref 5–15)
BUN: 14 mg/dL (ref 8–23)
CO2: 27 mmol/L (ref 22–32)
Calcium: 8.3 mg/dL — ABNORMAL LOW (ref 8.9–10.3)
Chloride: 108 mmol/L (ref 98–111)
Creatinine, Ser: 0.53 mg/dL (ref 0.44–1.00)
GFR calc Af Amer: >60 mL/min (ref >60)
GFR calc non Af Amer: >60 mL/min (ref >60)
Glucose, Bld: 97 mg/dL (ref 70–99)
Potassium: 4 mmol/L (ref 3.5–5.1)
Sodium: 142 mmol/L (ref 135–145)
Total Bilirubin: 0.5 mg/dL (ref 0.3–1.2)
Total Protein: 5.7 g/dL — ABNORMAL LOW (ref 6.5–8.1)

## 2018-11-03 MED ORDER — RISPERIDONE 0.5 MG PO TABS
0.5000 mg | ORAL_TABLET | Freq: Two times a day (BID) | ORAL | Status: DC
  Administered 2018-11-04: 0.5 mg via ORAL
  Filled 2018-11-03: qty 1

## 2018-11-03 MED ORDER — NITROFURANTOIN MACROCRYSTAL 100 MG PO CAPS
100.0000 mg | ORAL_CAPSULE | Freq: Two times a day (BID) | ORAL | Status: DC
  Administered 2018-11-03 – 2018-11-04 (×2): 100 mg via ORAL
  Filled 2018-11-03 (×2): qty 1

## 2018-11-03 MED ORDER — RISPERIDONE 0.25 MG PO TABS
0.2500 mg | ORAL_TABLET | Freq: Once | ORAL | Status: AC
  Administered 2018-11-03: 0.25 mg via ORAL
  Filled 2018-11-03: qty 1

## 2018-11-03 NOTE — Progress Notes (Signed)
[facility] PROGRESS NOTE  Shaneka Efaw [id number] DOB: [birth year] DOA: 10/29/2018 PCP: Alvester Morin, MD  Narrative: 85 year old [name] Severe dementia, HTN, left-sided intertrochanteric fracture 2019, frequent UTIs She was discharged 12/2017 after hip fracture and was recommended comfort care She is nonverbal at baseline  Found at facility fever 103-in the ED LFTs elevated platelets 91 COVID negative  CT abdomen pelvis?  Cholecystitis-General surgery curb sided  A & Plan Cholangitis vs pyelonephritis Gram neg rods in urine/blood HIDA - for cholecystitis ceftriaxone narrowed to macrodantin 5/11-end date 11/12/18 LFTs ? and continue to improve--will stop checking given good trend to normal labs Dementia-stage VII Depakote discontinue 2/2 transaminitis as above Risperdal from 0.25 bid Hold celexa [black box warning for ? mortality] Placed on Haldol--only prn HS HTN Hold HCTZ, hold lisinopril-monitor No ACE or diuretic for blood pressure control going forward  Hydralazine prn ordered Prior hip fracture 12/2017 schedule low tylenol Severe dementia and frailty No mortality benefit aspirin, multivitamin, supplements-recommend discussion at facility regarding the same And   DVT CD code Status: DNR Communication: called and d/w son Christean Grief who is aware of issues and impending d/c in am   Verlon Au, MD  [facility] Via [place] [email or web address] 7PM-7AM contact night coverage as above 11/03/2018, 4:46 PM  LOS: 4 days   Consultants:  None  Procedures:  No  Antimicrobials:  Zosyn-->:ceftriaxone-->macordantin  Interval history/Subjective:  More awake alert in no distress  Eating some  Objective:  Vitals:  Vitals:   11/03/18 1041 11/03/18 1600  BP:  (!) 152/77  Pulse: 74 (!) 105  Resp:  (!) 30  Temp:  97.9 F (36.6 C)  SpO2: 95% 93%    Exam:  No new issues  Arcus senilis Sleepy cta b no added sound s1 s  2no m abd soft nt nd no rebound Skin tear RLE   I have personally reviewed the following:  DATA   Labs:  Alk phos down from 249-191--->200--->168-->148-->135  White count down from 5.0-3.3   Imaging studies:  HIDA scan  IMPRESSION: 1. Patent cystic duct and common bile duct. 2. Filling of the gallbladder is inconsistent with acute cholecystitis.   Scheduled Meds: . amLODipine  10 mg Oral Daily  . donepezil  10 mg Oral QHS  . feeding supplement (ENSURE ENLIVE)  237 mL Oral TID BM  . haloperidol lactate  1 mg Intravenous QHS,MR X 1  . LORazepam  1 mg Intravenous Once  . nitrofurantoin  100 mg Oral Q12H  . potassium chloride  40 mEq Oral Daily  . risperiDONE  0.25 mg Oral BID   Continuous Infusions:   Principal Problem:   Cholecystitis without calculus Active Problems:   Alzheimer's dementia ([place])   HTN (hypertension)   Urinary tract infection without hematuria   Elevated LFTs   Cholecystitis   Acute metabolic encephalopathy   Thrombocytopenia (HCC)   Sepsis (HCC)   Cardiomegaly   Hypoalbuminemia   Cholelithiases   LOS: 4 days

## 2018-11-03 NOTE — Progress Notes (Signed)
   11/03/18 1520  Clinical Encounter Type  Visited With Patient  Visit Type Initial;Spiritual support  Referral From Care management  Consult/Referral To Chaplain  This chaplain responded to request for Pt. spiritual care from CM-KM. The chaplain was pastorally present at the Pt. bedside. The chaplain listened to the Pt. attempts to communicate and prayed with the Pt. during the visit.  The chaplain is available for F/U spiritual care as needed.

## 2018-11-03 NOTE — Care Management Important Message (Signed)
Important Message  Patient Details IM Letter given to [name] RN to present to the Patient Name: Rachael Hudson MRN: [id number] Date of Birth: [birth year]   Medicare Important Message Given:  Yes    Caren Macadam 11/03/2018, 11:33 AM

## 2018-11-04 LAB — COMPREHENSIVE METABOLIC PANEL
ALT: 89 U/L — ABNORMAL HIGH (ref 0–44)
AST: 20 U/L (ref 15–41)
Albumin: 3.2 g/dL — ABNORMAL LOW (ref 3.5–5.0)
Alkaline Phosphatase: 135 U/L — ABNORMAL HIGH (ref 38–126)
Anion gap: 9 (ref 5–15)
BUN: 18 mg/dL (ref 8–23)
CO2: 26 mmol/L (ref 22–32)
Calcium: 8.8 mg/dL — ABNORMAL LOW (ref 8.9–10.3)
Chloride: 108 mmol/L (ref 98–111)
Creatinine, Ser: 0.52 mg/dL (ref 0.44–1.00)
GFR calc Af Amer: >60 mL/min (ref >60)
GFR calc non Af Amer: >60 mL/min (ref >60)
Glucose, Bld: 103 mg/dL — ABNORMAL HIGH (ref 70–99)
Potassium: 4.4 mmol/L (ref 3.5–5.1)
Sodium: 143 mmol/L (ref 135–145)
Total Bilirubin: 0.3 mg/dL (ref 0.3–1.2)
Total Protein: 6.3 g/dL — ABNORMAL LOW (ref 6.5–8.1)

## 2018-11-04 MED ORDER — LORAZEPAM 1 MG PO TABS: 1.0000 mg | ORAL_TABLET | Freq: Every day | ORAL | 0 refills | Status: AC | PRN

## 2018-11-04 MED ORDER — AMLODIPINE BESYLATE 10 MG PO TABS: 10.0000 mg | ORAL_TABLET | Freq: Every day | ORAL | 0 refills | Status: AC

## 2018-11-04 MED ORDER — NITROFURANTOIN MACROCRYSTAL 100 MG PO CAPS: 100.0000 mg | ORAL_CAPSULE | Freq: Two times a day (BID) | ORAL | 0 refills | Status: AC

## 2018-11-04 MED ORDER — RISPERIDONE 0.5 MG PO TABS: 0.5000 mg | ORAL_TABLET | Freq: Two times a day (BID) | ORAL | 0 refills | Status: AC

## 2018-11-04 NOTE — TOC Transition Note (Signed)
Transition of Care [facility]) - CM/SW Discharge Note   Patient Details  Name: Tifa Bengtson MRN: [id number] Date of Birth: [birth year]  [facility]) CM/SW Contact:  [name], RN Phone Number: 11/04/2018, 2:17 PM   Clinical Narrative: Returning back to [facility] care,rep Tammy.[facility] called for transport--Nsg notified.      Final next level of care: Assisted Living Barriers to Discharge: No Barriers Identified   Patient Goals and CMS Choice Patient states their goals for this hospitalization and ongoing recovery are:: per son return back to memory care @ [facility] [email or web address] Compare Post Acute Care list provided to:: Patient Represenative (must comment) Choice offered to / list presented to : Adult Children  Discharge Placement              Patient chooses bed at: ([facility]) Patient to be transferred to facility by: Sharin Mons) Name of family member notified: Edwin Patient and family notified of of transfer: 11/04/18  Discharge Plan and Services   Discharge Planning Services: CM Consult                                 Social Determinants of Health (SDOH) Interventions     Readmission Risk Interventions No flowsheet data found.

## 2018-11-04 NOTE — Discharge Summary (Addendum)
Physician Discharge Summary  Rachael Hudson ZOX:[id number] DOB: 1923/10/05 DOA: 10/29/2018  PCP: Keane Police, MD  Admit date: 10/29/2018 Discharge date: 11/04/2018  Time spent: 25 minutes  Recommendations for Outpatient Follow-up:  1. Needs continue goals of care discussions as an outpatient 2. Note dosage change of Risperdal this admission and have discontinued Celexa given blackbox warnings 3. Have also discontinued Depakote because of transaminitis as this is a possible side effect would not use this in the future 4. Please reorient as possible at facility 5. Needs to complete Macrodantin as per below MAR on 5/19 2020 for possible bacteremia secondary to cholangitis 6. Minimize meds as appropriate  Discharge Diagnoses:  Principal Problem:   Cholecystitis without calculus Active Problems:   Alzheimer's dementia (HCC)   HTN (hypertension)   Urinary tract infection without hematuria   Elevated LFTs   Cholecystitis   Acute metabolic encephalopathy   Thrombocytopenia (HCC)   Sepsis (HCC)   Cardiomegaly   Hypoalbuminemia   Cholelithiases   Discharge Condition: Guarded  Diet recommendation: Please liberalize diet to give her whatever she wants and allow her ample coffee  Filed Weights   10/30/18 0207  Weight: 59.4 kg    History of present illness:  85 year old [place] ALF resident Severe dementia, HTN, left-sided intertrochanteric fracture 2019, frequent UTIs She was discharged 12/2017 after hip fracture and was recommended comfort care  Found at facility fever 103-in the ED LFTs elevated platelets 91 COVID negative  CT abdomen pelvis?  Cholecystitis-General surgery curb sidedAnd did not feel that the patient had any real risk factors for surgery, HIDA scan did not prove cholecystitis Patient grew out organism as below and blood and urine  Hospital Course:  Cholangitis vs pyelonephritis E. coli in urine/blood HIDA  was negative for  cholecystitis ceftriaxone narrowed to macrodantin 5/11-end date 11/12/18 LFTs ? and continue to improve--outpatient recheck Dementia-stage VII Depakote discontinue 2/2 transaminitis as above Risperdal from 0.25 bid Hold celexa [black box warning for ? mortality] Placed on Haldol at night and patient has recovered and is closer to her normal state on discharge HTN Hold HCTZ, hold lisinopril-monitor No ACE or diuretic for blood pressure control going forward  Hydralazine prn ordered On discharge giving amlodipine Prior hip fracture 12/2017 schedule low tylenol Stop OXY Severe dementia and frailty No mortality benefit aspirin, multivitamin, supplements-recommend discussion at facility regarding the same   Procedures:  HIDA scan  CT abdomen pelvis   Consultations:  General surgery curb sided on admission  Discharge Exam: Vitals:   11/04/18 0507 11/04/18 0508  BP: (!) 189/84 (!) 165/70  Pulse: 72 70  Resp: 18   Temp: 97.7 F (36.5 C)   SpO2: 91%     General: Awake alert pleasant no distress EOMI NCAT no focal deficit scleral icterus extremely hard of hearing able however to orient to some degree Cardiovascular: S1-S2 no murmur rub or gallop Respiratory: Clinically clear no added sound Abdomen soft nontender no rebound no guarding No lower extremity edema she has heel protectors on  Discharge Instructions   Discharge Instructions    Diet - low sodium heart healthy   Complete by:  As directed    Increase activity slowly   Complete by:  As directed      Allergies as of 11/04/2018      Reactions   Fluoxetine Other (See Comments)   Unknown - unable to confirm via MAR   Sulfa Antibiotics Other (See Comments)   Unknown - unable to confirm via [facility]  Medication List    STOP taking these medications   cephALEXin 500 MG capsule Commonly known as:  KEFLEX   citalopram 20 MG tablet Commonly known as:  CELEXA   divalproex 125 MG capsule Commonly known as:   DEPAKOTE SPRINKLE   hydrochlorothiazide 12.5 MG capsule Commonly known as:  MICROZIDE   HYDROcodone-acetaminophen 5-325 MG tablet Commonly known as:  NORCO/VICODIN   lisinopril 20 MG tablet Commonly known as:  ZESTRIL     TAKE these medications   acetaminophen 325 MG tablet Commonly known as:  TYLENOL Take 650 mg by mouth QID.   amLODipine 10 MG tablet Commonly known as:  NORVASC Take 1 tablet (10 mg total) by mouth daily.   Aspirin 81 81 MG chewable tablet Generic drug:  aspirin Chew 81 mg by mouth daily. What changed:  Another medication with the same name was removed. Continue taking this medication, and follow the directions you see here.   donepezil 10 MG tablet Commonly known as:  ARICEPT Take 10 mg by mouth at bedtime.   LORazepam 1 MG tablet Commonly known as:  ATIVAN Take 1 tablet (1 mg total) by mouth daily as needed. What changed:  reasons to take this   multivitamin-lutein Caps capsule Take 1 capsule by mouth daily.   nitrofurantoin 100 MG capsule Commonly known as:  MACRODANTIN Take 1 capsule (100 mg total) by mouth every 12 (twelve) hours for 7 days.   NUTRITIONAL SHAKE PLUS PO Take 237 mLs by mouth 3 (three) times daily.   omeprazole 20 MG capsule Commonly known as:  PRILOSEC Take 20 mg by mouth daily.   risperiDONE 0.5 MG tablet Commonly known as:  RISPERDAL Take 1 tablet (0.5 mg total) by mouth 2 (two) times daily. What changed:    medication strength  how much to take  when to take this   senna-docusate 8.6-50 MG tablet Commonly known as:  Senokot-S Take 1 tablet by mouth at bedtime.      Allergies  Allergen Reactions  . Fluoxetine Other (See Comments)    Unknown - unable to confirm via MAR  . Sulfa Antibiotics Other (See Comments)    Unknown - unable to confirm via [facility]      The results of significant diagnostics from this hospitalization (including imaging, microbiology, ancillary and laboratory) are listed below for  reference.    Significant Diagnostic Studies: Ct Head Wo Contrast  Result Date: 10/29/2018 CLINICAL DATA:  Altered LOC EXAM: CT HEAD WITHOUT CONTRAST TECHNIQUE: Contiguous axial images were obtained from the base of the skull through the vertex without intravenous contrast. COMPARISON:  CT brain 05/12/2018, 11/27/2017, 02/04/2016 FINDINGS: Brain: No acute territorial infarction, hemorrhage or intracranial mass. Advanced atrophy. Stable ventricle size. Extensive hypodensity within the periventricular and deep white matter, unchanged and presumably due to small vessel ischemic changes. Vascular: No hyperdense vessels.  Carotid vascular calcification. Skull: Normal. Negative for fracture or focal lesion. Sinuses/Orbits: Mild mucosal thickening in the ethmoid sinuses. Other: None IMPRESSION: 1. No CT evidence for acute intracranial abnormality. 2. Atrophy and extensive small vessel ischemic changes of the white matter Electronically Signed   By: Jasmine Pang M.D.   On: 10/29/2018 22:22   Ct Chest W Contrast  Result Date: 10/29/2018 CLINICAL DATA:  Fever EXAM: CT CHEST, ABDOMEN, AND PELVIS WITH CONTRAST TECHNIQUE: Multidetector CT imaging of the chest, abdomen and pelvis was performed following the standard protocol during bolus administration of intravenous contrast. CONTRAST:  OMNIPAQUE IOHEXOL 300 MG/ML  SOLN COMPARISON:  Chest x-ray today FINDINGS: CT CHEST FINDINGS Cardiovascular: Heart is borderline in size. Scattered coronary artery and aortic calcifications. No aneurysm. Mediastinum/Nodes: No mediastinal, hilar, or axillary adenopathy. Lungs/Pleura: Small bilateral effusions. Bilateral lower lobe atelectasis. Platelike atelectasis or scarring in the right middle lobe. Mild vascular congestion. Musculoskeletal: Chest wall soft tissues are unremarkable. No acute bony abnormality. CT ABDOMEN PELVIS FINDINGS Hepatobiliary: Gallbladder wall appears mildly thickened. No visible stones. No focal hepatic  abnormality or biliary ductal dilatation. Pancreas: No focal abnormality or ductal dilatation. Spleen: No focal abnormality.  Normal size. Adrenals/Urinary Tract: Very large cyst off the lower pole of the left kidney measuring up to 13.3 cm. There is moderate left hydronephrosis, likely related to mass effect from the large renal cyst. No visible stones. Small cyst in the midpole of the right kidney. No hydronephrosis on the right. Adrenal glands and urinary bladder unremarkable. Stomach/Bowel: Moderate stool throughout the colon. Sigmoid diverticulosis. No active diverticulitis. Stomach and small bowel decompressed, unremarkable. No evidence of bowel obstruction. Vascular/Lymphatic: Aortic atherosclerosis. No enlarged abdominal or pelvic lymph nodes. Reproductive: Uterus and adnexa unremarkable.  No mass. Other: No free fluid or free air. Musculoskeletal: No acute bony abnormality. Degenerative changes in the lumbar spine. IMPRESSION: Small bilateral pleural effusions with compressive atelectasis in the lower lobes. Cardiomegaly.  Mild pulmonary vascular congestion. Scattered moderate coronary artery calcifications. Indistinctness and apparent wall thickening of the gallbladder wall. No visible stones. This could reflect cholecystitis. Consider further evaluation with right upper quadrant ultrasound. Moderate left hydronephrosis due to large exophytic cyst off the lower pole of the left kidney causing mass effect at the pelvic brim. Moderate stool burden.  Sigmoid diverticulosis. Aortic atherosclerosis. Electronically Signed   By: Charlett Nose M.D.   On: 10/29/2018 22:24   Nm Hepatobiliary Liver Func  Result Date: 10/30/2018 CLINICAL DATA:  Elevated bilirubin. Known gallstones and gallbladder sludge. EXAM: NUCLEAR MEDICINE HEPATOBILIARY IMAGING TECHNIQUE: Sequential images of the abdomen were obtained out to 60 minutes following intravenous administration of radiopharmaceutical. RADIOPHARMACEUTICALS:  2.5 mCi  Tc-13m  Choletec IV COMPARISON:  5 ultrasound 10/29/2018 FINDINGS: Rapid clearance of radiotracer from the blood pool and homogeneous uptake throughout liver parenchyma. Counts are evident within the common bile duct and small bowel by 20 minutes. The gallbladder was not confidently identified at 60 minutes therefore IV morphine bolus was administered. Following morphine administration (stimulates sphincter of ODI contraction), the gallbladder fills. IMPRESSION: 1. Patent cystic duct and common bile duct. 2. Filling of the gallbladder is inconsistent with acute cholecystitis. Electronically Signed   By: Genevive Bi M.D.   On: 10/30/2018 12:41   Ct Abdomen Pelvis W Contrast  Result Date: 10/29/2018 CLINICAL DATA:  Fever EXAM: CT CHEST, ABDOMEN, AND PELVIS WITH CONTRAST TECHNIQUE: Multidetector CT imaging of the chest, abdomen and pelvis was performed following the standard protocol during bolus administration of intravenous contrast. CONTRAST:  OMNIPAQUE IOHEXOL 300 MG/ML  SOLN COMPARISON:  Chest x-ray today FINDINGS: CT CHEST FINDINGS Cardiovascular: Heart is borderline in size. Scattered coronary artery and aortic calcifications. No aneurysm. Mediastinum/Nodes: No mediastinal, hilar, or axillary adenopathy. Lungs/Pleura: Small bilateral effusions. Bilateral lower lobe atelectasis. Platelike atelectasis or scarring in the right middle lobe. Mild vascular congestion. Musculoskeletal: Chest wall soft tissues are unremarkable. No acute bony abnormality. CT ABDOMEN PELVIS FINDINGS Hepatobiliary: Gallbladder wall appears mildly thickened. No visible stones. No focal hepatic abnormality or biliary ductal dilatation. Pancreas: No focal abnormality or ductal dilatation. Spleen: No focal abnormality.  Normal size. Adrenals/Urinary Tract: Very large cyst  off the lower pole of the left kidney measuring up to 13.3 cm. There is moderate left hydronephrosis, likely related to mass effect from the large renal cyst.  No visible stones. Small cyst in the midpole of the right kidney. No hydronephrosis on the right. Adrenal glands and urinary bladder unremarkable. Stomach/Bowel: Moderate stool throughout the colon. Sigmoid diverticulosis. No active diverticulitis. Stomach and small bowel decompressed, unremarkable. No evidence of bowel obstruction. Vascular/Lymphatic: Aortic atherosclerosis. No enlarged abdominal or pelvic lymph nodes. Reproductive: Uterus and adnexa unremarkable.  No mass. Other: No free fluid or free air. Musculoskeletal: No acute bony abnormality. Degenerative changes in the lumbar spine. IMPRESSION: Small bilateral pleural effusions with compressive atelectasis in the lower lobes. Cardiomegaly.  Mild pulmonary vascular congestion. Scattered moderate coronary artery calcifications. Indistinctness and apparent wall thickening of the gallbladder wall. No visible stones. This could reflect cholecystitis. Consider further evaluation with right upper quadrant ultrasound. Moderate left hydronephrosis due to large exophytic cyst off the lower pole of the left kidney causing mass effect at the pelvic brim. Moderate stool burden.  Sigmoid diverticulosis. Aortic atherosclerosis. Electronically Signed   By: Charlett Nose M.D.   On: 10/29/2018 22:24   Dg Chest Port 1 View  Result Date: 10/29/2018 CLINICAL DATA:  Fever EXAM: PORTABLE CHEST 1 VIEW COMPARISON:  05/12/2018 FINDINGS: Cardiomegaly with vascular congestion. Elevation of the right hemidiaphragm. Bibasilar atelectasis with small effusions. No overt edema. No acute bony abnormality. IMPRESSION: Cardiomegaly with vascular congestion. Bibasilar atelectasis with small effusions. Electronically Signed   By: Charlett Nose M.D.   On: 10/29/2018 19:35   US Abdomen Limited Ruq  Result Date: 10/29/2018 CLINICAL DATA:  Right upper quadrant pain, fever EXAM: ULTRASOUND ABDOMEN LIMITED RIGHT UPPER QUADRANT COMPARISON:  CT 10/29/2018 FINDINGS: Gallbladder: Gallbladder wall is  thickened at 6 mm. Stones and sludge within the gallbladder. The largest stone 7 mm. Common bile duct: Diameter: Upper limits normal in diameter, 7 mm. Liver: No focal lesion identified. Within normal limits in parenchymal echogenicity. Portal vein is patent on color Doppler imaging with normal direction of blood flow towards the liver. IMPRESSION: Stones and sludge within the gallbladder. There is gallbladder wall thickening. Findings concerning for cholecystitis. Electronically Signed   By: Charlett Nose M.D.   On: 10/29/2018 23:41    Microbiology: Recent Results (from the past 240 hour(s))  Blood culture (routine x 2)     Status: Abnormal   Collection Time: 10/29/18  7:18 PM  Result Value Ref Range Status   Specimen Description   Final    BLOOD RIGHT ARM Performed at [facility], [address] [place], [place] [address]    Special Requests   Final    BOTTLES DRAWN AEROBIC AND ANAEROBIC Blood Culture adequate volume Performed at [facility], [address] [place], [place] [address]    Culture  Setup Time   Final    GRAM NEGATIVE RODS ANAEROBIC BOTTLE ONLY CRITICAL RESULT CALLED TO, READ BACK BY AND VERIFIED WITH: Janine Limbo PHARMD AT 1230 ON [id number] BY SJW Performed at [facility], [address], [place], [place] [address]    Culture ESCHERICHIA COLI (A)  Final   Report Status 11/01/2018 FINAL  Final   Organism ID, Bacteria ESCHERICHIA COLI  Final      Susceptibility   Escherichia coli - MIC*    AMPICILLIN >=32 RESISTANT Resistant     CEFAZOLIN <=4 SENSITIVE Sensitive     CEFEPIME <=1 SENSITIVE Sensitive  CEFTAZIDIME <=1 SENSITIVE Sensitive     CEFTRIAXONE <=1 SENSITIVE Sensitive     CIPROFLOXACIN >=4 RESISTANT Resistant     GENTAMICIN <=1 SENSITIVE Sensitive     IMIPENEM <=0.25 SENSITIVE Sensitive     TRIMETH/SULFA <=20 SENSITIVE Sensitive     AMPICILLIN/SULBACTAM 8 SENSITIVE Sensitive     PIP/TAZO <=4 SENSITIVE Sensitive     Extended  ESBL NEGATIVE Sensitive     * ESCHERICHIA COLI  SARS Coronavirus 2 (CEPHEID - Performed in [facility] hospital lab), Hosp Order     Status: None   Collection Time: 10/29/18  7:18 PM  Result Value Ref Range Status   SARS Coronavirus 2 NEGATIVE NEGATIVE Final    Comment: (NOTE) If result is NEGATIVE SARS-CoV-2 target nucleic acids are NOT DETECTED. The SARS-CoV-2 RNA is generally detectable in upper and lower  respiratory specimens during the acute phase of infection. The lowest  concentration of SARS-CoV-2 viral copies this assay can detect is 250  copies / mL. A negative result does not preclude SARS-CoV-2 infection  and should not be used as the sole basis for treatment or other  patient management decisions.  A negative result may occur with  improper specimen collection / handling, submission of specimen other  than nasopharyngeal swab, presence of viral mutation(s) within the  areas targeted by this assay, and inadequate number of viral copies  (<250 copies / mL). A negative result must be combined with clinical  observations, patient history, and epidemiological information. If result is POSITIVE SARS-CoV-2 target nucleic acids are DETECTED. The SARS-CoV-2 RNA is generally detectable in upper and lower  respiratory specimens dur ing the acute phase of infection.  Positive  results are indicative of active infection with SARS-CoV-2.  Clinical  correlation with patient history and other diagnostic information is  necessary to determine patient infection status.  Positive results do  not rule out bacterial infection or co-infection with other viruses. If result is PRESUMPTIVE POSTIVE SARS-CoV-2 nucleic acids MAY BE PRESENT.   A presumptive positive result was obtained on the submitted specimen  and confirmed on repeat testing.  While 2019 novel coronavirus  (SARS-CoV-2) nucleic acids may be present in the submitted sample  additional confirmatory testing may be necessary for  epidemiological  and / or clinical management purposes  to differentiate between  SARS-CoV-2 and other Sarbecovirus currently known to infect humans.  If clinically indicated additional testing with an alternate test  methodology [phone]) is advised. The SARS-CoV-2 RNA is generally  detectable in upper and lower respiratory sp ecimens during the acute  phase of infection. The expected result is Negative. Fact Sheet for Patients:  BoilerBrush.com.cy Fact Sheet for Healthcare Providers: https://pope.com/ This test is not yet approved or cleared by the [place] FDA and has been authorized for detection and/or diagnosis of SARS-CoV-2 by FDA under an Emergency Use Authorization (EUA).  This EUA will remain in effect (meaning this test can be used) for the duration of the COVID-19 declaration under Section 564(b)(1) of the Act, 21 U.S.C. section 360bbb-3(b)(1), unless the authorization is terminated or revoked sooner. Performed at [facility], [address] [place], [place] [address]   Blood Culture ID Panel (Reflexed)     Status: Abnormal   Collection Time: 10/29/18  7:18 PM  Result Value Ref Range Status   Enterococcus species NOT DETECTED NOT DETECTED Final   Listeria monocytogenes NOT DETECTED NOT DETECTED Final   Staphylococcus species NOT DETECTED NOT DETECTED Final   Staphylococcus aureus (  BCID) NOT DETECTED NOT DETECTED Final   Streptococcus species NOT DETECTED NOT DETECTED Final   Streptococcus agalactiae NOT DETECTED NOT DETECTED Final   Streptococcus pneumoniae NOT DETECTED NOT DETECTED Final   Streptococcus pyogenes NOT DETECTED NOT DETECTED Final   Acinetobacter baumannii NOT DETECTED NOT DETECTED Final   Enterobacteriaceae species DETECTED (A) NOT DETECTED Final    Comment: Enterobacteriaceae represent a large family of gram-negative bacteria, not a single organism. CRITICAL RESULT CALLED TO, READ  BACK BY AND VERIFIED WITH: SWAYNE M PHARMD AT 1230 ON [id number] BY SJW    Enterobacter cloacae complex NOT DETECTED NOT DETECTED Final   Escherichia coli DETECTED (A) NOT DETECTED Final    Comment: CRITICAL RESULT CALLED TO, READ BACK BY AND VERIFIED WITH: SWAYNE M PHARMD AT 1230 ON [id number] BY SJW    Klebsiella oxytoca NOT DETECTED NOT DETECTED Final   Klebsiella pneumoniae NOT DETECTED NOT DETECTED Final   Proteus species NOT DETECTED NOT DETECTED Final   Serratia marcescens NOT DETECTED NOT DETECTED Final   Carbapenem resistance NOT DETECTED NOT DETECTED Final   Haemophilus influenzae NOT DETECTED NOT DETECTED Final   Neisseria meningitidis NOT DETECTED NOT DETECTED Final   Pseudomonas aeruginosa NOT DETECTED NOT DETECTED Final   Candida albicans NOT DETECTED NOT DETECTED Final   Candida glabrata NOT DETECTED NOT DETECTED Final   Candida krusei NOT DETECTED NOT DETECTED Final   Candida parapsilosis NOT DETECTED NOT DETECTED Final   Candida tropicalis NOT DETECTED NOT DETECTED Final    Comment: Performed at [facility], [address], [place], [place] [address]  Urine culture     Status: Abnormal   Collection Time: 10/29/18  8:10 PM  Result Value Ref Range Status   Specimen Description   Final    URINE, RANDOM Performed at [facility], [address] [place], [place] [address]    Special Requests   Final    NONE Performed at [facility] [address], [place], [place] [address]    Culture >=100,000 COLONIES/mL ESCHERICHIA COLI (A)  Final   Report Status 10/31/2018 FINAL  Final   Organism ID, Bacteria ESCHERICHIA COLI (A)  Final      Susceptibility   Escherichia coli - MIC*    AMPICILLIN >=32 RESISTANT Resistant     CEFAZOLIN <=4 SENSITIVE Sensitive     CEFTRIAXONE <=1 SENSITIVE Sensitive     CIPROFLOXACIN >=4 RESISTANT Resistant     GENTAMICIN <=1 SENSITIVE Sensitive     IMIPENEM <=0.25 SENSITIVE Sensitive     NITROFURANTOIN  <=16 SENSITIVE Sensitive     TRIMETH/SULFA <=20 SENSITIVE Sensitive     AMPICILLIN/SULBACTAM 8 SENSITIVE Sensitive     PIP/TAZO <=4 SENSITIVE Sensitive     Extended ESBL NEGATIVE Sensitive     * >=100,000 COLONIES/mL ESCHERICHIA COLI  Blood culture (routine x 2)     Status: Abnormal   Collection Time: 10/29/18  9:40 PM  Result Value Ref Range Status   Specimen Description   Final    BLOOD LEFT FOREARM Performed at [facility], [address] [place], [place] [address]    Special Requests   Final    BOTTLES DRAWN AEROBIC AND ANAEROBIC Blood Culture adequate volume Performed at [facility], [address], [place], [place] [address]    Culture  Setup Time   Final    GRAM NEGATIVE RODS IN BOTH AEROBIC AND ANAEROBIC BOTTLES CRITICAL VALUE NOTED.  VALUE IS CONSISTENT WITH  PREVIOUSLY REPORTED AND CALLED VALUE.    Culture (A)  Final    ESCHERICHIA COLI SUSCEPTIBILITIES PERFORMED ON PREVIOUS CULTURE WITHIN THE LAST 5 DAYS. Performed at [facility], [address] [place], [place] [address]    Report Status 11/01/2018 FINAL  Final  MRSA PCR Screening     Status: None   Collection Time: 11/01/18  5:54 AM  Result Value Ref Range Status   MRSA by PCR NEGATIVE NEGATIVE Final    Comment:        The GeneXpert MRSA Assay (FDA approved for NASAL specimens only), is one component of a comprehensive MRSA colonization surveillance program. It is not intended to diagnose MRSA infection nor to guide or monitor treatment for MRSA infections. Performed at [facility], [address] [place], [place] [address]      Labs: Basic Metabolic Panel: Recent Labs  Lab 10/30/18 0558 10/31/18 0538 11/01/18 0510 11/02/18 0517 11/03/18 0426 11/04/18 0502  NA 141 143 143 141 142 143  K 3.5 3.2* 3.3* 3.5 4.0 4.4  CL 107 109 107 108 108 108  CO2 27 24 26 25 27 26   GLUCOSE 85 77 87 95 97 103*  BUN 16 21 20 14 14 18   CREATININE 0.54 0.57  0.47 0.43* 0.53 0.52  CALCIUM 8.2* 8.2* 8.2* 8.2* 8.3* 8.8*  MG 2.1  --   --   --   --   --   PHOS 3.4  --   --   --   --   --    Liver Function Tests: Recent Labs  Lab 10/31/18 0538 11/01/18 0510 11/02/18 0517 11/03/18 0426 11/04/18 0502  AST 63* 35 26 25 20   ALT 281* 195* 140* 108* 89*  ALKPHOS 200* 168* 148* 135* 135*  BILITOT 0.7 0.6 0.5 0.5 0.3  PROT 5.7* 5.5* 5.5* 5.7* 6.3*  ALBUMIN 2.8* 2.7* 2.8* 2.8* 3.2*   No results for input(s): LIPASE, AMYLASE in the last 168 hours. Recent Labs  Lab 10/30/18 0558 10/31/18 0538  AMMONIA 18 15   CBC: Recent Labs  Lab 10/29/18 1918 10/30/18 0558 10/31/18 0538 11/01/18 0510 11/02/18 0517  WBC 5.0 3.3* 3.1* 3.1* 3.6*  NEUTROABS 4.3  --  2.1 1.6* 2.1  HGB 13.4 12.6 12.3 11.9* 12.0  HCT 40.5 39.7 38.1 38.2 37.9  MCV 96.4 99.3 99.2 98.7 97.7  PLT 91* 118* 123* 124* 143*   Cardiac Enzymes: Recent Labs  Lab 10/29/18 2145 10/29/18 2230  CKTOTAL 47  --   TROPONINI  --  0.03*   BNP: BNP (last 3 results) Recent Labs    10/29/18 1917  BNP 129.5*    ProBNP (last 3 results) No results for input(s): PROBNP in the last 8760 hours.  CBG: No results for input(s): GLUCAP in the last 168 hours.     Signed:  Rhetta MuraJai-Gurmukh Herschel Fleagle MD   [facility] 11/04/2018, 9:41 AM

## 2019-01-30 ENCOUNTER — Emergency Department (HOSPITAL_COMMUNITY)
Admission: EM | Admit: 2019-01-30 | Discharge: 2019-01-31 | Disposition: A | Discharge status: Skilled Nursing Facility | Payer: Medicare Other | Attending: Emergency Medicine | Admitting: Emergency Medicine

## 2019-01-30 ENCOUNTER — Encounter (HOSPITAL_COMMUNITY): Payer: Self-pay

## 2019-01-30 ENCOUNTER — Emergency Department (HOSPITAL_COMMUNITY): Payer: Medicare Other

## 2019-01-30 ENCOUNTER — Other Ambulatory Visit: Payer: Self-pay

## 2019-01-30 DIAGNOSIS — G309 Alzheimer's disease, unspecified: Secondary | ICD-10-CM | POA: Diagnosis not present

## 2019-01-30 DIAGNOSIS — Y92122 Bedroom in nursing home as the place of occurrence of the external cause: Secondary | ICD-10-CM | POA: Diagnosis not present

## 2019-01-30 DIAGNOSIS — I10 Essential (primary) hypertension: Secondary | ICD-10-CM | POA: Insufficient documentation

## 2019-01-30 DIAGNOSIS — Y999 Unspecified external cause status: Secondary | ICD-10-CM | POA: Diagnosis not present

## 2019-01-30 DIAGNOSIS — W19XXXA Unspecified fall, initial encounter: Secondary | ICD-10-CM

## 2019-01-30 DIAGNOSIS — W06XXXA Fall from bed, initial encounter: Secondary | ICD-10-CM | POA: Diagnosis not present

## 2019-01-30 DIAGNOSIS — Z7982 Long term (current) use of aspirin: Secondary | ICD-10-CM | POA: Insufficient documentation

## 2019-01-30 DIAGNOSIS — S01312A Laceration without foreign body of left ear, initial encounter: Secondary | ICD-10-CM | POA: Diagnosis not present

## 2019-01-30 DIAGNOSIS — S0990XA Unspecified injury of head, initial encounter: Secondary | ICD-10-CM | POA: Diagnosis present

## 2019-01-30 DIAGNOSIS — F0280 Dementia in other diseases classified elsewhere without behavioral disturbance: Secondary | ICD-10-CM | POA: Insufficient documentation

## 2019-01-30 DIAGNOSIS — Z79899 Other long term (current) drug therapy: Secondary | ICD-10-CM | POA: Diagnosis not present

## 2019-01-30 DIAGNOSIS — Y9389 Activity, other specified: Secondary | ICD-10-CM | POA: Insufficient documentation

## 2019-01-30 MED ORDER — LIDOCAINE HCL (PF) 1 % IJ SOLN
10.0000 mL | Freq: Once | INTRAMUSCULAR | Status: AC
  Administered 2019-01-30: 10 mL
  Filled 2019-01-30: qty 30

## 2019-01-30 NOTE — ED Notes (Signed)
Patient had hard bowel movement. One large round ball of stool. Another large ball of stool cleaned as well.

## 2019-01-30 NOTE — ED Triage Notes (Signed)
Per EMS: Pt from [facility]. Pt hx of alzheimer's and dementia.  Pt rolled out of bed and received lac to L ear.  Pt at baseline, A&O to self only.

## 2019-01-30 NOTE — ED Provider Notes (Signed)
[facility] Note   CSN: [id number] Arrival date & time: 01/30/19  1732     History   Chief Complaint No chief complaint on file.   HPI Rachael Hudson is a 85 y.o. female.  [facility] reported the patient has a history of Alzheimer's, dementia, rolled out of bed and fell on the left side of her head suffering a laceration to her left ear.  Is reportedly at her baseline self currently.  Time of interview, patient denies any complaints.    HPI  Past Medical History:  Diagnosis Date   Alzheimer disease (HCC)    Anemia    HOH (hard of hearing)    Hypertension    Restless leg syndrome     Patient Active Problem List   Diagnosis Date Noted   Cholelithiases 10/30/2018   Elevated LFTs 10/29/2018   Cholecystitis without calculus 10/29/2018   Cholecystitis 10/29/2018   Acute metabolic encephalopathy 10/29/2018   Thrombocytopenia (HCC) 10/29/2018   Sepsis (HCC) 10/29/2018   Cardiomegaly 10/29/2018   Hypoalbuminemia 10/29/2018   Urinary tract infection without hematuria    Alzheimer's dementia (HCC) 01/19/2018   HTN (hypertension) 01/19/2018   Hip fracture (HCC) 01/18/2018    Past Surgical History:  Procedure Laterality Date   INTRAMEDULLARY (IM) NAIL INTERTROCHANTERIC Left 01/19/2018   Procedure: INTRAMEDULLARY (IM) NAIL INTERTROCHANTRIC;  Surgeon: Eldred MangesYates, Mark C, MD;  Location: [facility] ORS;  Service: Orthopedics;  Laterality: Left;   JOINT REPLACEMENT     right hip fracture       OB History   No obstetric history on file.      Home Medications    Prior to Admission medications   Medication Sig Start Date End Date Taking? Authorizing Provider  acetaminophen (TYLENOL) 325 MG tablet Take 650 mg by mouth QID.   Yes [provider]  amLODipine (NORVASC) 10 MG tablet Take 1 tablet (10 mg total) by mouth daily. 11/04/18  Yes Rhetta MuraSamtani, Jai-Gurmukh, MD  aspirin (ASPIRIN 81) 81 MG chewable tablet Chew  81 mg by mouth daily.   Yes [provider]  donepezil (ARICEPT) 10 MG tablet Take 10 mg by mouth at bedtime.   Yes [provider]  multivitamin-lutein (OCUVITE-LUTEIN) CAPS capsule Take 1 capsule by mouth daily.   Yes [provider]  Nutritional Supplements (NUTRITIONAL SHAKE PLUS PO) Take 237 mLs by mouth 3 (three) times daily.   Yes [provider]  omeprazole (PRILOSEC) 20 MG capsule Take 20 mg by mouth daily. 05/12/18  Yes [provider]  risperiDONE (RISPERDAL) 0.5 MG tablet Take 1 tablet (0.5 mg total) by mouth 2 (two) times daily. 11/04/18  Yes Rhetta MuraSamtani, Jai-Gurmukh, MD  senna-docusate (SENOKOT-S) 8.6-50 MG tablet Take 1 tablet by mouth at bedtime.   Yes [provider]  LORazepam (ATIVAN) 1 MG tablet Take 1 tablet (1 mg total) by mouth daily as needed. Patient taking differently: Take 1 mg by mouth daily as needed for anxiety.  11/04/18   Rhetta MuraSamtani, Jai-Gurmukh, MD    Family History Family History  Problem Relation Age of Onset   Hypertension Other     Social History Social History   Tobacco Use   Smoking status: Never Smoker   Smokeless tobacco: Never Used  Substance Use Topics   Alcohol use: Not Currently   Drug use: Not Currently     Allergies   Fluoxetine and Sulfa antibiotics   Review of Systems Review of Systems  Unable to perform ROS: Dementia  Physical Exam Updated Vital Signs BP (!) 160/72 (BP Location: Left Arm)    Pulse 85    Temp 97.9 F (36.6 C) (Axillary)    Resp 18    SpO2 98%   Physical Exam Vitals signs and nursing note reviewed.  Constitutional:      General: She is not in acute distress.    Appearance: She is well-developed.  HENT:     Head: Normocephalic.     Ears:      Comments: 1 cm laceration over base of left earlobe, does not involve cartilage, no active bleeding Eyes:     Conjunctiva/sclera: Conjunctivae normal.  Neck:     Musculoskeletal: Neck supple.    Cardiovascular:     Rate and Rhythm: Normal rate and regular rhythm.     Heart sounds: No murmur.  Pulmonary:     Effort: Pulmonary effort is normal. No respiratory distress.     Breath sounds: Normal breath sounds.  Abdominal:     Palpations: Abdomen is soft.     Tenderness: There is no abdominal tenderness.  Skin:    General: Skin is warm and dry.  Neurological:     Mental Status: She is alert.     Comments: Alert, oriented to person but not place or time      ED Treatments / Results  Labs (all labs ordered are listed, but only abnormal results are displayed) Labs Reviewed - No data to display  EKG None  Radiology Ct Head Wo Contrast  Result Date: 01/30/2019 CLINICAL DATA:  85 year old female with laceration to the left ear. Head trauma. EXAM: CT HEAD WITHOUT CONTRAST CT CERVICAL SPINE WITHOUT CONTRAST TECHNIQUE: Multidetector CT imaging of the head and cervical spine was performed following the standard protocol without intravenous contrast. Multiplanar CT image reconstructions of the cervical spine were also generated. COMPARISON:  Head CT dated 10/29/2018 FINDINGS: CT HEAD FINDINGS Brain: There is advanced age-related atrophy and chronic microvascular ischemic changes. There is no acute intracranial hemorrhage. No mass effect or midline shift. No extra-axial fluid collection. Vascular: No hyperdense vessel or unexpected calcification. Skull: Normal. Negative for fracture or focal lesion. Sinuses/Orbits: There is mucoperiosteal thickening of the right maxillary sinus. The remainder of the visualized paranasal sinuses and mastoid air cells are clear. Other: None CT CERVICAL SPINE FINDINGS Alignment: No acute subluxation. Skull base and vertebrae: No acute fracture. No primary bone lesion or focal pathologic process. Soft tissues and spinal canal: No prevertebral fluid or swelling. No visible canal hematoma. Disc levels: Multilevel degenerative changes. Grade 1 C3-C4 anterolisthesis.  Upper chest: Negative. Other: Mild bilateral carotid bulb calcified plaques. IMPRESSION: 1. No acute intracranial hemorrhage. 2. Age-related atrophy and chronic microvascular ischemic changes. 3. No acute/traumatic cervical spine pathology. Electronically Signed   By: Elgie CollardArash  Radparvar M.D.   On: 01/30/2019 21:18   Ct Cervical Spine Wo Contrast  Result Date: 01/30/2019 CLINICAL DATA:  85 year old female with laceration to the left ear. Head trauma. EXAM: CT HEAD WITHOUT CONTRAST CT CERVICAL SPINE WITHOUT CONTRAST TECHNIQUE: Multidetector CT imaging of the head and cervical spine was performed following the standard protocol without intravenous contrast. Multiplanar CT image reconstructions of the cervical spine were also generated. COMPARISON:  Head CT dated 10/29/2018 FINDINGS: CT HEAD FINDINGS Brain: There is advanced age-related atrophy and chronic microvascular ischemic changes. There is no acute intracranial hemorrhage. No mass effect or midline shift. No extra-axial fluid collection. Vascular: No hyperdense vessel or unexpected calcification. Skull: Normal. Negative for fracture or focal lesion.  Sinuses/Orbits: There is mucoperiosteal thickening of the right maxillary sinus. The remainder of the visualized paranasal sinuses and mastoid air cells are clear. Other: None CT CERVICAL SPINE FINDINGS Alignment: No acute subluxation. Skull base and vertebrae: No acute fracture. No primary bone lesion or focal pathologic process. Soft tissues and spinal canal: No prevertebral fluid or swelling. No visible canal hematoma. Disc levels: Multilevel degenerative changes. Grade 1 C3-C4 anterolisthesis. Upper chest: Negative. Other: Mild bilateral carotid bulb calcified plaques. IMPRESSION: 1. No acute intracranial hemorrhage. 2. Age-related atrophy and chronic microvascular ischemic changes. 3. No acute/traumatic cervical spine pathology. Electronically Signed   By: Anner Crete M.D.   On: 01/30/2019 21:18     Procedures .Marland KitchenLaceration Repair  Date/Time: 01/31/2019 12:23 AM Performed by: Lucrezia Starch, MD Authorized by: Lucrezia Starch, MD   Anesthesia (see MAR for exact dosages):    Anesthesia method:  None Laceration details:    Location:  Ear   Ear location:  L ear   Length (cm):  1 Repair type:    Repair type:  Simple Treatment:    Area cleansed with:  Hibiclens   Amount of cleaning:  Extensive   Irrigation solution:  Sterile water Skin repair:    Repair method:  Sutures   Suture size:  5-0   Suture material:  Nylon   Number of sutures:  4 Approximation:    Approximation:  Close   (including critical care time)  Medications Ordered in ED Medications  lidocaine (PF) (XYLOCAINE) 1 % injection 10 mL (has no administration in time range)     Initial Impression / Assessment and Plan / ED Course  I have reviewed the triage vital signs and the nursing notes.  Pertinent labs & imaging results that were available during my care of the patient were reviewed by me and considered in my medical decision making (see chart for details).        85 year old lady with fall, baseline mental status, noted laceration to left ear.  CT is negative for acute traumatic process.  Performed primary closure.  No cartilaginous involvement.  No loss of tissue.  Patient will need follow-up in 1 week for suture removal.  Final Clinical Impressions(s) / ED Diagnoses   Final diagnoses:  Ear lobe laceration, left, initial encounter  Fall, initial encounter    ED Discharge Orders    None       Lucrezia Starch, MD 01/31/19 [phone]

## 2019-01-31 NOTE — ED Notes (Signed)
Attempted to call report to [facility] x2 with no answer. [facility] called for transport

## 2019-01-31 NOTE — Discharge Instructions (Signed)
Need suture removal in 1 week for ear laceration.  Please keep the area clean and dry, follow wound care instructions as discussed.  Please return to ER if she develops redness, swelling around site of laceration.

## 2019-08-24 DEATH — deceased

## 2020-03-17 IMAGING — NM NUCLEAR MEDICINE HEPATOBILIARY INCLUDE GB
2 series · 12 of 12 positions shown · non-contrast
Comparison: 5 ultrasound 10/29/2018

CLINICAL DATA: Elevated bilirubin. Known gallstones and gallbladder
sludge.

EXAM:
NUCLEAR MEDICINE HEPATOBILIARY IMAGING
TECHNIQUE: Sequential images of the abdomen were obtained [DATE] minutes
following intravenous administration of radiopharmaceutical.
RADIOPHARMACEUTICALS:  2.5 mCi Oc-00m  Choletec IV

[Series 1: biliary · 3.25mm/px · 6 of 60 frames shown]
[frame 6/60]
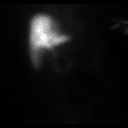
[frame 16/60]
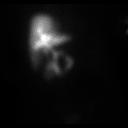
[frame 26/60]
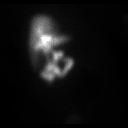
[frame 36/60]
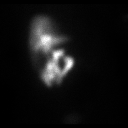
[frame 46/60]
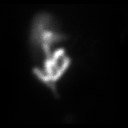
[frame 56/60]
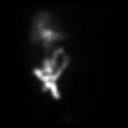

[Series 2: anterior · 3.25mm/px · 6 of 30 frames shown]
[frame 3/30]
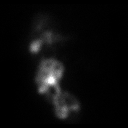
[frame 8/30]
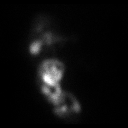
[frame 13/30]
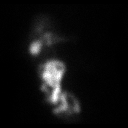
[frame 18/30]
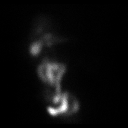
[frame 23/30]
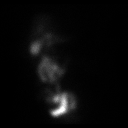
[frame 28/30]
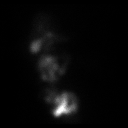

[12 of 12 positions shown; findings below may reference images not displayed]

FINDINGS: Rapid clearance of radiotracer from the blood pool and homogeneous
uptake throughout liver parenchyma. Counts are evident within the
common bile duct and small bowel by 20 minutes. The gallbladder was
not confidently identified at 60 minutes therefore IV morphine bolus
was administered. Following morphine administration (stimulates
sphincter of ARISSA contraction), the gallbladder fills.
IMPRESSION: 1. Patent cystic duct and common bile duct.
2. Filling of the gallbladder is inconsistent with acute
cholecystitis.
